# Patient Record
Sex: Male | Born: 1975 | Hispanic: Yes | Marital: Married | Smoking: Never smoker
Health system: Southern US, Community
[De-identification: ages and names within clinical notes are randomized; demographics above are authoritative.]

## PROBLEM LIST (undated history)

## (undated) DIAGNOSIS — Z789 Other specified health status: Secondary | ICD-10-CM

## (undated) HISTORY — PX: CYST EXCISION: SHX5701

---

## 2011-06-05 ENCOUNTER — Ambulatory Visit: Payer: Self-pay

## 2011-06-05 DIAGNOSIS — L259 Unspecified contact dermatitis, unspecified cause: Secondary | ICD-10-CM

## 2011-06-05 DIAGNOSIS — L0291 Cutaneous abscess, unspecified: Secondary | ICD-10-CM

## 2014-09-17 ENCOUNTER — Ambulatory Visit (INDEPENDENT_AMBULATORY_CARE_PROVIDER_SITE_OTHER): Payer: Self-pay | Admitting: Physician Assistant

## 2014-09-17 VITALS — BP 130/70 | HR 71 | Temp 98.2°F | Resp 16 | Ht 70.5 in | Wt 210.0 lb

## 2014-09-17 DIAGNOSIS — J302 Other seasonal allergic rhinitis: Secondary | ICD-10-CM

## 2014-09-17 DIAGNOSIS — J069 Acute upper respiratory infection, unspecified: Secondary | ICD-10-CM

## 2014-09-17 MED ORDER — GUAIFENESIN ER 1200 MG PO TB12: 1.0000 | ORAL_TABLET | Freq: Two times a day (BID) | ORAL | Status: DC | PRN

## 2014-09-17 MED ORDER — IPRATROPIUM BROMIDE 0.03 % NA SOLN: 2.0000 | Freq: Two times a day (BID) | NASAL | Status: DC

## 2014-09-17 MED ORDER — CETIRIZINE HCL 10 MG PO TABS: 10.0000 mg | ORAL_TABLET | Freq: Every day | ORAL | Status: DC

## 2014-09-17 NOTE — Patient Instructions (Signed)
If you start to develop a cough, contact me.    Infeccin de las vas areas superiores en los adultos (Upper Respiratory Infection, Adult)  La infeccin respiratoria de las vas areas superiores se conoce tambin como resfro comn. Las vas areas superiores [employer] senos nasales, la garganta, la trquea, y los bronquios. Los bronquios son las vas areas que conducen el aire a los pulmones. La mayor parte de las personas mejora luego de una Watson, Biomedical engineer los sntomas pueden durar [employer]. La tos residual puede durar ms. CAUSAS Varios tipos de virus pueden causar la infeccin de los tejidos que cubren las vas areas superiores. Los tejidos se irritan y se inflaman y se originan secreciones. Tambin es frecuente la produccin de moco. El resfro es contagioso. El virus se disemina fcilmente a otras personas por contacto oral. Aqu se incluyen los besos, el compartir un vaso y el toser o Engineering geologist. Tambin puede diseminarse tocndose la boca o la [place] y luego tocando una superficie que luego tocan Economist.  SNTOMAS Los sntomas se desarrollan entre uno y [place] luego de Cytogeneticist en contacto con el virus. Pueden variar de Neomia Dear persona a otra. Incluyen:  Secrecin nasal.  Estornudos  Congestin nasal.  Irritacin de los senos nasales.  Dolor de Advertising copywriter.  Prdida de la voz (laringitis).  Tos.  Fatiga.  Dolores musculares.  Prdida del apetito.  Dolor de [place].  Fiebre no muy elevada. DIAGNSTICO Puede diagnosticarse a s mismo la infeccin respiratoria, segn los sntomas habituales, ya que la mayor parte de las personas se resfra dos o tres veces al ao. El profesional puede confirmarlo basndose en el examen fsico. Lo ms importante es que el profesional verifique que los sntomas no se deben a otra enfermedad como anginas, sinusitis, neumona, asma o epiglotitis. Para diagnosticar el resfrio comn, no es necesario que haga anlisis de [place], pruebas  en la garganta o radiografas, pero en algunos casos puede ser de utilidad para excluir otros problemas ms graves. El mdico decidir si necesita otras pruebas. RIESGOS Y COMPLICACIONES Tendr mayor riesgo de sufrir un resfro grave si consume cigarrillos, sufre una enfermedad cardaca (como insuficiencia cardaca) o pulmonar crnica (como asma) o si tiene un debilitamiento del sistema inmunolgico. Las personas muy jvenes o muy mayores tienen riesgo de sufrir infecciones ms graves. La sinusitis bacteriana, las infecciones del odo medio y la neumona bacteriana pueden complicar el resfro comn. El resfro puede exacerbar el asma y la enfermedad pulmonar obstructiva crnica. En algunos casos estas complicaciones requieren la atencin en un servicio de emergencias y pueden poner en peligro la vida. PREVENCIN La mejor manera de protegerse para no contraer un resfro es Pharmacologist una buena higiene. Evite el contacto bucal o de las manos con personas con sntomas de resfro. Si se produce el contacto, lvese las manos con frecuencia. No hay pruebas firmes que indiquen que la vitamina C, la vitamina E, la equincea o la actividad fsica reduzcan las posibilidades de tener una infeccin. Sin embargo, siempre se recomienda Insurance account manager y Winferd Humphrey buena nutricin. TRATAMIENTO El tratamiento est dirigido a Consulting civil engineer sntomas. Esta enfermedad no tiene [place]. Los antibiticos no son eficaces, ya que esta infeccin la causa un virus y no una bacteria. El tratamiento incluye:  Aumente la ingesta de lquidos. Consumo de bebidas deportivas, que proporcionan electrolitos,azcares e hidratacin.  Inhale vapor caliente (de un vaporizador o de la ducha).  Tomar sopa de pollo u otros lquidos claros, y [employer] buena nutricin.  Descanse lo suficiente.  Haga grgaras o coma pastillas para Altria Groupaliviar las molestias.  Control de la fiebre con ibuprofeno o acetaminofen, segn las indicaciones del  mdico.  Aumento del uso del inhalador, si sufre asma. Las pastillas y los geles de zinc durante las primeras 24 horas de iniciado el resfro comn, pueden disminuir la duracin y Paramedicaliviar la gravedad de los sntomas. Los medicamentos para Chief Technology Officerel dolor pueden disminuir la fiebre, Paramedicaliviar los dolores musculares y Chief Technology Officerel dolor de Advertising copywritergarganta. Se dispone de una gran variedad de medicamentos de venta libre para tratar la congestin y la secrecin nasal. El profesional podr recomendarle inhalantes para los otros sntomas. INSTRUCCIONES PARA EL CUIDADO DOMICILIARIO  Utilice los medicamentos de venta libre o de prescripcin para Chief Technology Officerel dolor, el malestar o la [place]fiebre, segn se lo indique el profesional que lo asiste.  Utilice un vaporizador caliente o inhale vapor, haciendo salir agua de la ducha para aumentar la humedad [place] Cityambiente. Esto mantendr las secreciones hmedas y Community education officerle resultar ms fcil respirar.  Beba gran cantidad de lquido para mantener la orina de tono claro o color amarillo plido.  Descanse todo lo que pueda.  Regrese a su trabajo cuando la temperatura se haya normalizado, o cuando el profesional que lo asiste se lo indique. Quizs sea necesario que permanezca en su casa durante un tiempo ms prolongado para Buyer, retailevitar infectar a Economistotras personas. Tambin puede utilizar un barbijo y ser cuidadoso con el lavado de manos para evitar la diseminacin del virus. SOLICITE ATENCIN MDICA SI:  Luego de los primeros das siente que empeora en vez de [place]mejorar.  Necesita que Occupational psychologistel profesional le brinde ms informacin relacionada con los medicamentos para AGCO Corporationcontrolar los sntomas.  Siente escalofros, le falta el aire o escupe moco de color marrn o rojo. Estos pueden ser sntomas de neumona.  Tiene una secrecin nasal de color amarillo o marrn, o siente dolor en el rostro, especialmente cuando se inclina hacia adelante. Estos pueden ser sntomas de sinusitis.  Tiene fiebre, siente el cuello hinchado, tiene dolor al  tragar u observa manchas blancas en el fondo de la garganta. Estos pueden ser sntomas de angina por estreptococo. Romona CurlsSOLICITE ATENCIN MDICA DE INMEDIATO SI:  Lance Mussiene fiebre.  Comienza a sentir Herbalistun dolor de cabeza intenso o persistente, dolor de odos, en el seno nasal o en el pecho.  Tiene tos y esta se prolonga demasiado, tose y escupe sangre, la mucosidad habitual se modifica (si tiene una enfermedad pulmonar crnica) o respira con dificultad.  Siente rigidez en el cuello o dolor de cabeza intenso. Document Released: 02/27/2005 Document Revised: 08/12/2011 [facility] Patient Information 2015 [place]ExitCare, MarylandLL[place]. This information is not intended to replace advice given to you by your health care provider. Make sure you discuss any questions you have with your health care provider.

## 2014-09-17 NOTE — Progress Notes (Signed)
[facility] [address], [place] [id number] [phone]  Date:  09/17/2014   Name:  Patrick Diaz   DOB:  [birth year]   MRN:  [id number]  PCP:  No primary care provider on file.    Chief Complaint: Headache and Sore Throat   History of Present Illness:  Patrick Diaz is a 39 y.o. very pleasant male patient who presents with the following:  Patient reports 2 days of sorethroat, nasal congestion, cough, frontal headache.  He reports some chills.  He has done nothing for relief.  He denies sob, dyspnea.  Cough is non-productive.  He has noticed increased congestion since the .  He has noticed his symptoms have been present since the pollen erupted.  He has noticed that with the start of pollen he has had some allergies.   He has no GI symptoms of abdominal pain, nausea, vomiting, or diarrhea.   There are no active problems to display for this patient.   History reviewed. No pertinent past medical history.  History reviewed. No pertinent past surgical history.  History  Substance Use Topics  . Smoking status: Never Smoker   . Smokeless tobacco: Not on file  . Alcohol Use: Not on file    Family History  Problem Relation Age of Onset  . Cancer Mother     No Known Allergies  Medication list has been reviewed and updated.  No current outpatient prescriptions on file prior to visit.   No current facility-administered medications on file prior to visit.    Review of Systems: ROS otherwise unremarkable unless listed above.   Physical Examination: Filed Vitals:   09/17/14 0855  BP: 130/70  Pulse: 71  Temp: 98.2 F (36.8 C)  Resp: 16   Filed Vitals:   09/17/14 0855  Height: 5' 10.5" (1.791 m)  Weight: 210 lb (95.255 kg)   Body mass index is 29.7 kg/(m^2). Ideal Body Weight: Weight in (lb) to have BMI = 25: 176.4  Physical Exam  Constitutional: He appears well-developed and well-nourished.  HENT:  Head: Normocephalic and atraumatic.   Right Ear: Tympanic membrane, external ear and ear canal normal.  Left Ear: Tympanic membrane, external ear and ear canal normal.  Nose: Mucosal edema and rhinorrhea present. Right sinus exhibits no maxillary sinus tenderness and no frontal sinus tenderness. Left sinus exhibits no maxillary sinus tenderness and no frontal sinus tenderness.  Mouth/Throat: Posterior oropharyngeal erythema (Mild erythema with nasal drip) present. No oropharyngeal exudate or posterior oropharyngeal edema.  Eyes: Conjunctivae and EOM are normal. Pupils are equal, round, and reactive to light.  Cardiovascular: Normal rate and regular rhythm.  Exam reveals no gallop, no distant heart sounds and no friction rub.   No murmur heard. Pulmonary/Chest: No apnea and no tachypnea. No respiratory distress. He has no decreased breath sounds. He has no wheezes. He has no rhonchi.  Lymphadenopathy:       Head (right side): No submandibular, no tonsillar, no preauricular and no posterior auricular adenopathy present.       Head (left side): No submandibular, no tonsillar, no preauricular and no posterior auricular adenopathy present.  Skin: Skin is warm and dry.  Psychiatric: He has a normal mood and affect. His behavior is normal.     Assessment and Plan: 39 year old male with PMH listed above is here today for chief complaint of nasal congestion, sore throat, and headache.  This is likely viral URI, early in the symptoms, with allergies.  Upper  respiratory infection, viral - Plan: Guaifenesin (MUCINEX MAXIMUM STRENGTH) 1200 MG TB12, ipratropium (ATROVENT) 0.03 % nasal spray  Seasonal allergies - Plan: ipratropium (ATROVENT) 0.03 % nasal spray, cetirizine (ZYRTEC) 10 MG tablet  Trena Platt, PA-C [facility] 4/17/20167:34 PM

## 2016-09-03 ENCOUNTER — Ambulatory Visit (HOSPITAL_COMMUNITY)
Admission: EM | Admit: 2016-09-03 | Discharge: 2016-09-03 | Disposition: A | Discharge status: Home / Self Care | Payer: Self-pay | Attending: Family Medicine | Admitting: Family Medicine

## 2016-09-03 ENCOUNTER — Encounter (HOSPITAL_COMMUNITY): Payer: Self-pay | Admitting: Emergency Medicine

## 2016-09-03 ENCOUNTER — Ambulatory Visit (INDEPENDENT_AMBULATORY_CARE_PROVIDER_SITE_OTHER): Payer: Self-pay

## 2016-09-03 DIAGNOSIS — M542 Cervicalgia: Secondary | ICD-10-CM

## 2016-09-03 DIAGNOSIS — M791 Myalgia: Secondary | ICD-10-CM

## 2016-09-03 DIAGNOSIS — M7918 Myalgia, other site: Secondary | ICD-10-CM

## 2016-09-03 MED ORDER — DICLOFENAC SODIUM 75 MG PO TBEC: 75.0000 mg | DELAYED_RELEASE_TABLET | Freq: Two times a day (BID) | ORAL | 1 refills | Status: DC
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
## 2016-12-03 ENCOUNTER — Ambulatory Visit (INDEPENDENT_AMBULATORY_CARE_PROVIDER_SITE_OTHER): Payer: Self-pay

## 2016-12-03 ENCOUNTER — Ambulatory Visit (INDEPENDENT_AMBULATORY_CARE_PROVIDER_SITE_OTHER): Payer: Self-pay | Admitting: Physician Assistant

## 2016-12-03 ENCOUNTER — Encounter: Payer: Self-pay | Admitting: Physician Assistant

## 2016-12-03 VITALS — BP 123/70 | HR 60 | Temp 97.2°F | Resp 17 | Ht 70.0 in | Wt 223.0 lb

## 2016-12-03 DIAGNOSIS — R079 Chest pain, unspecified: Secondary | ICD-10-CM

## 2016-12-03 LAB — POCT URINALYSIS DIP (MANUAL ENTRY)
BILIRUBIN UA: NEGATIVE
Glucose, UA: NEGATIVE mg/dL
Ketones, POC UA: NEGATIVE mg/dL
Leukocytes, UA: NEGATIVE
NITRITE UA: NEGATIVE
Protein Ur, POC: NEGATIVE mg/dL
RBC UA: NEGATIVE
Spec Grav, UA: 1.015 (ref 1.010–1.025)
Urobilinogen, UA: 0.2 E.U./dL
pH, UA: 7 (ref 5.0–8.0)

## 2016-12-03 LAB — POCT CBC
Granulocyte percent: 63.1 %G (ref 37–80)
HEMATOCRIT: 42.9 % — AB (ref 43.5–53.7)
Hemoglobin: 14.7 g/dL (ref 14.1–18.1)
Lymph, poc: 1.9 (ref 0.6–3.4)
MCH: 30.9 pg (ref 27–31.2)
MCHC: 34.2 g/dL (ref 31.8–35.4)
MCV: 90.3 fL (ref 80–97)
MID (CBC): 0.3 (ref 0–0.9)
MPV: 6.6 fL (ref 0–99.8)
POC GRANULOCYTE: 3.8 (ref 2–6.9)
POC LYMPH PERCENT: 31.1 %L (ref 10–50)
POC MID %: 5.8 %M (ref 0–12)
Platelet Count, POC: 278 10*3/uL (ref 142–424)
RBC: 4.76 M/uL (ref 4.69–6.13)
RDW, POC: 13.1 %
WBC: 6 10*3/uL (ref 4.6–10.2)

## 2016-12-03 MED ORDER — NAPROXEN 500 MG PO TABS
500.0000 mg | ORAL_TABLET | Freq: Two times a day (BID) | ORAL | 0 refills | Status: DC
Start: 2016-12-03 — End: 2018-06-24

## 2016-12-03 NOTE — Progress Notes (Signed)
12/03/2016 3:01 PM   DOB: April 02, 1976 / MRN: [id number]  SUBJECTIVE:  Patrick Diaz is a 41 y.o. male presenting for right sided rib pain that has been present for about 3 days.  Tells me his ribs are tender to touch.  Denies pain with respiration, cough, nausea, diaphoresis, presyncope, SOB and new DOE.   He has No Known Allergies.   He  has no past medical history on file.    He  reports that he has never smoked. He has never used smokeless tobacco. He reports that he does not drink alcohol or use drugs. He  reports that he does not engage in sexual activity. The patient  has no past surgical history on file.  His family history includes Cancer in his mother.  Review of Systems  Constitutional: Negative for chills, diaphoresis and fever.  Respiratory: Negative for cough, hemoptysis, sputum production, shortness of breath and wheezing.   Cardiovascular: Positive for chest pain. Negative for orthopnea and leg swelling.  Gastrointestinal: Negative for nausea.  Skin: Negative for rash.  Neurological: Negative for dizziness.    The problem list and medications were reviewed and updated by myself where necessary and exist elsewhere in the encounter.   OBJECTIVE:  BP 123/70   Pulse 60   Temp (!) 97.2 F (36.2 C) (Oral)   Resp 17   Ht _0  (1.778 m)   Wt 223 lb (101.2 kg)   SpO2 98%   BMI 32.00 kg/m   No results found for: HGBA1C  No results found for: CHOL, HDL, LDLCALC, LDLDIRECT, TRIG, CHOLHDL    Physical Exam  Constitutional: He is oriented to person, place, and time. He appears well-developed. He is active and cooperative.  Non-toxic appearance.  Eyes: EOM are normal. Pupils are equal, round, and reactive to light.  Cardiovascular: Normal rate, regular rhythm, S1 normal, S2 normal, normal heart sounds, intact distal pulses and normal pulses.  Exam reveals no gallop and no friction rub.   No murmur heard. Pulmonary/Chest: Effort normal. No stridor. No tachypnea. No  respiratory distress. He has no wheezes. He has no rales.    Abdominal: He exhibits no distension.  Musculoskeletal: He exhibits no edema.  Neurological: He is alert and oriented to person, place, and time. He has normal strength and normal reflexes. He is not disoriented. No cranial nerve deficit or sensory deficit. He exhibits normal muscle tone. Coordination and gait normal.  Skin: Skin is warm and dry. He is not diaphoretic. No pallor.  Psychiatric: His behavior is normal.  Vitals reviewed.   Results for orders placed or performed in visit on 12/03/16 (from the past 72 hour(s))  POCT CBC     Status: Abnormal   Collection Time: 12/03/16  2:24 PM  Result Value Ref Range   WBC 6.0 4.6 - 10.2 K/uL   Lymph, poc 1.9 0.6 - 3.4   POC LYMPH PERCENT 31.1 10 - 50 %L   MID (cbc) 0.3 0 - 0.9   POC MID % 5.8 0 - 12 %M   POC Granulocyte 3.8 2 - 6.9   Granulocyte percent 63.1 37 - 80 %G   RBC 4.76 4.69 - 6.13 M/uL   Hemoglobin 14.7 14.1 - 18.1 g/dL   HCT, POC 42.9 (A) 43.5 - 53.7 %   MCV 90.3 80 - 97 fL   MCH, POC 30.9 27 - 31.2 pg   MCHC 34.2 31.8 - 35.4 g/dL   RDW, POC 13.1 %   Platelet Count,  POC 278 142 - 424 K/uL   MPV 6.6 0 - 99.8 fL  POCT urinalysis dipstick     Status: None   Collection Time: 12/03/16  2:58 PM  Result Value Ref Range   Color, UA yellow yellow   Clarity, UA clear clear   Glucose, UA negative negative mg/dL   Bilirubin, UA negative negative   Ketones, POC UA negative negative mg/dL   Spec Grav, UA 1.015 1.010 - 1.025   Blood, UA negative negative   pH, UA 7.0 5.0 - 8.0   Protein Ur, POC negative negative mg/dL   Urobilinogen, UA 0.2 0.2 or 1.0 E.U./dL   Nitrite, UA Negative Negative   Leukocytes, UA Negative Negative    Dg Chest 2 View  Result Date: 12/03/2016 CLINICAL DATA:  Left-sided rib pain.  No injury. EXAM: CHEST  2 VIEW COMPARISON:  No prior . FINDINGS: No acute cardiopulmonary disease. Low lung volumes with mild basilar atelectasis. No pleural  effusion or pneumothorax. No acute bony abnormality . IMPRESSION: No acute cardiopulmonary disease. Electronically Signed   By: Marcello Moores  Register   On: 12/03/2016 14:34    ASSESSMENT AND PLAN:  Cebert was seen today for chest pain.  Diagnoses and all orders for this visit:  Chest pain, unspecified type: EKG reassuring.  Rads reassuring.  TTP about the ribs is reassuring.  Will treat MSK etiology.  He can come back anytime for this problem.  -     EKG 12-Lead -     POCT CBC -     CMP14+EGFR -     POCT urinalysis dipstick -     DG Chest 2 View; Future    The patient is advised to call or return to clinic if he does not see an improvement in symptoms, or to seek the care of the closest emergency department if he worsens with the above plan.   Philis Fendt, MHS, PA-C [facility] 12/03/2016 3:01 PM

## 2016-12-03 NOTE — Patient Instructions (Signed)
     IF you received an x-ray today, you will receive an invoice from [facility]. Please contact [facility] at [phone] with questions or concerns regarding your invoice.   IF you received labwork today, you will receive an invoice from [facility]. Please contact [facility] at [phone] with questions or concerns regarding your invoice.   Our billing staff will not be able to assist you with questions regarding bills from these companies.  You will be contacted with the lab results as soon as they are available. The fastest way to get your results is to activate your My Chart account. Instructions are located on the last page of this paperwork. If you have not heard from us regarding the results in 2 weeks, please contact this office.     

## 2016-12-04 LAB — CMP14+EGFR
ALT: 25 IU/L (ref 0–44)
AST: 23 IU/L (ref 0–40)
Albumin/Globulin Ratio: 1.7 (ref 1.2–2.2)
Albumin: 4.1 g/dL (ref 3.5–5.5)
Alkaline Phosphatase: 93 IU/L (ref 39–117)
BUN/Creatinine Ratio: 18 (ref 9–20)
BUN: 13 mg/dL (ref 6–24)
Bilirubin Total: 0.4 mg/dL (ref 0.0–1.2)
CO2: 22 mmol/L (ref 20–29)
CREATININE: 0.72 mg/dL — AB (ref 0.76–1.27)
Calcium: 9.1 mg/dL (ref 8.7–10.2)
Chloride: 104 mmol/L (ref 96–106)
GFR calc non Af Amer: 115 mL/min/{1.73_m2} (ref >59)
GFR, EST AFRICAN AMERICAN: 133 mL/min/{1.73_m2} (ref >59)
Globulin, Total: 2.4 g/dL (ref 1.5–4.5)
Glucose: 97 mg/dL (ref 65–99)
Potassium: 4.1 mmol/L (ref 3.5–5.2)
Sodium: 141 mmol/L (ref 134–144)
Total Protein: 6.5 g/dL (ref 6.0–8.5)

## 2018-04-27 IMAGING — DX DG CHEST 2V
2 series · 2 of 2 positions shown · non-contrast
Comparison: No prior .

CLINICAL DATA: Left-sided rib pain.  No injury.

EXAM:
CHEST  2 VIEW

[chest pa]
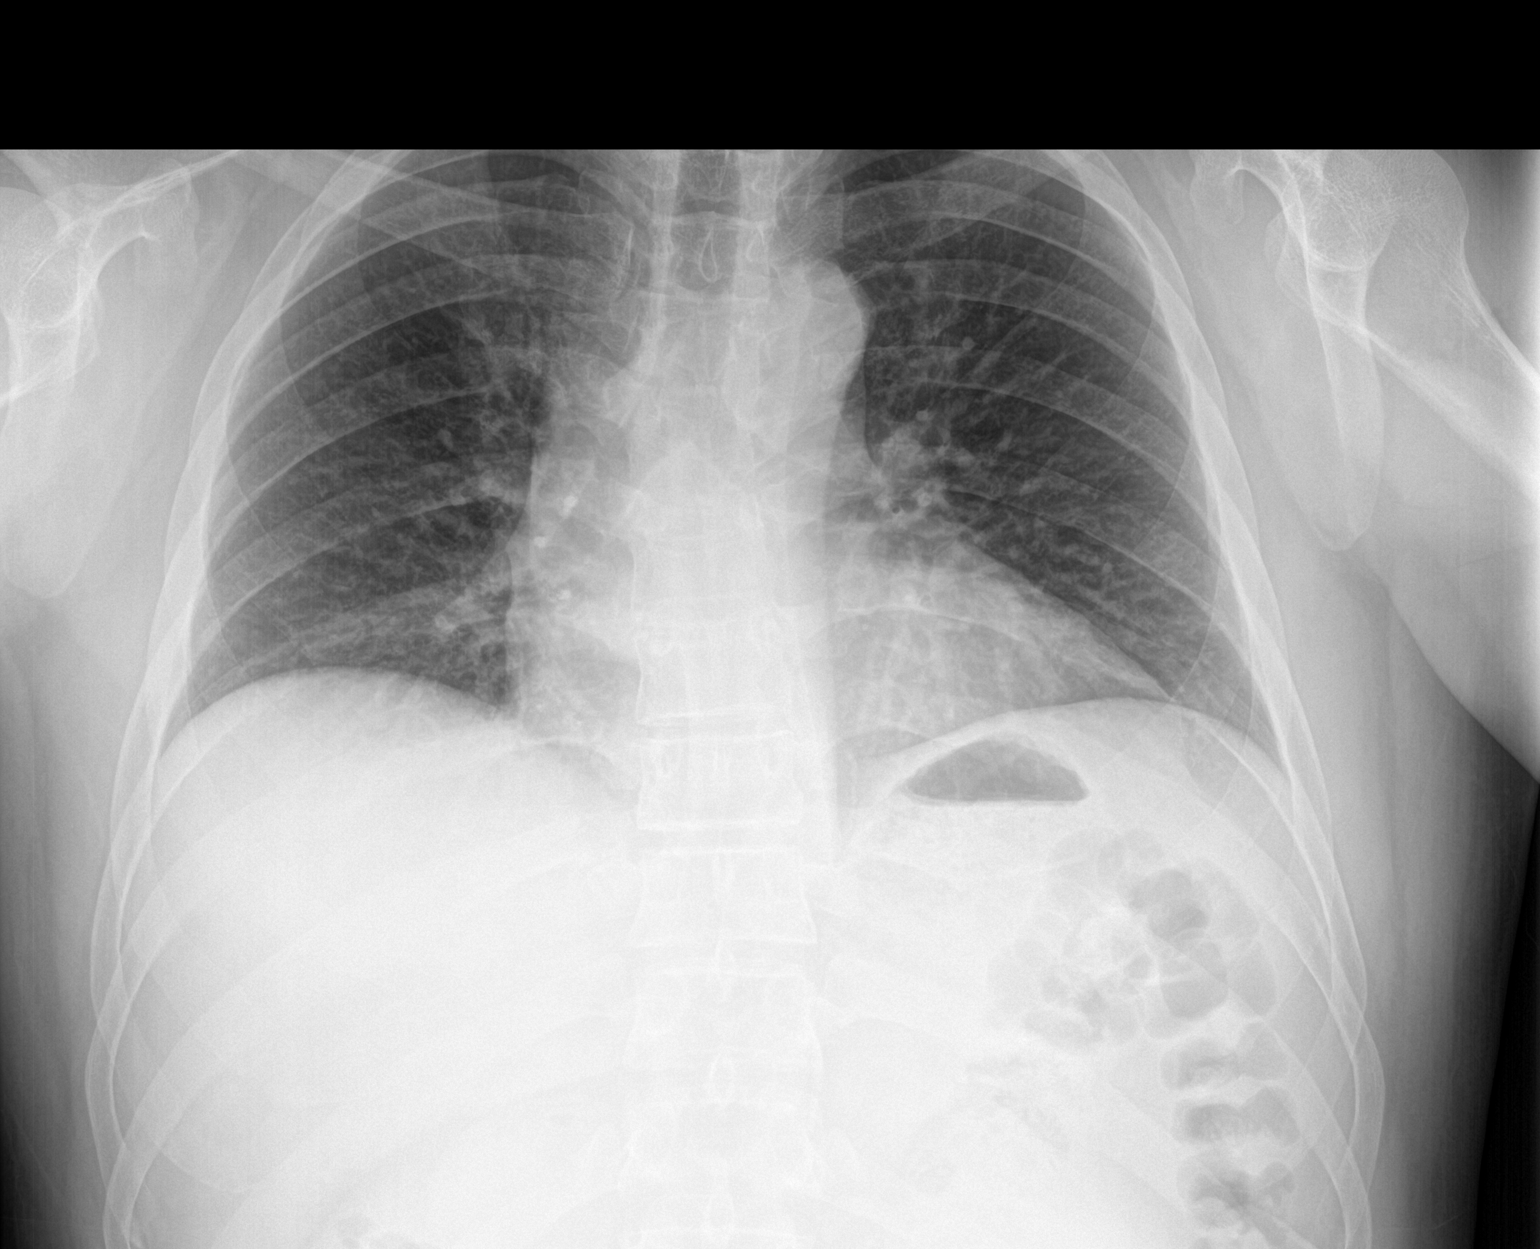

[chest lat]
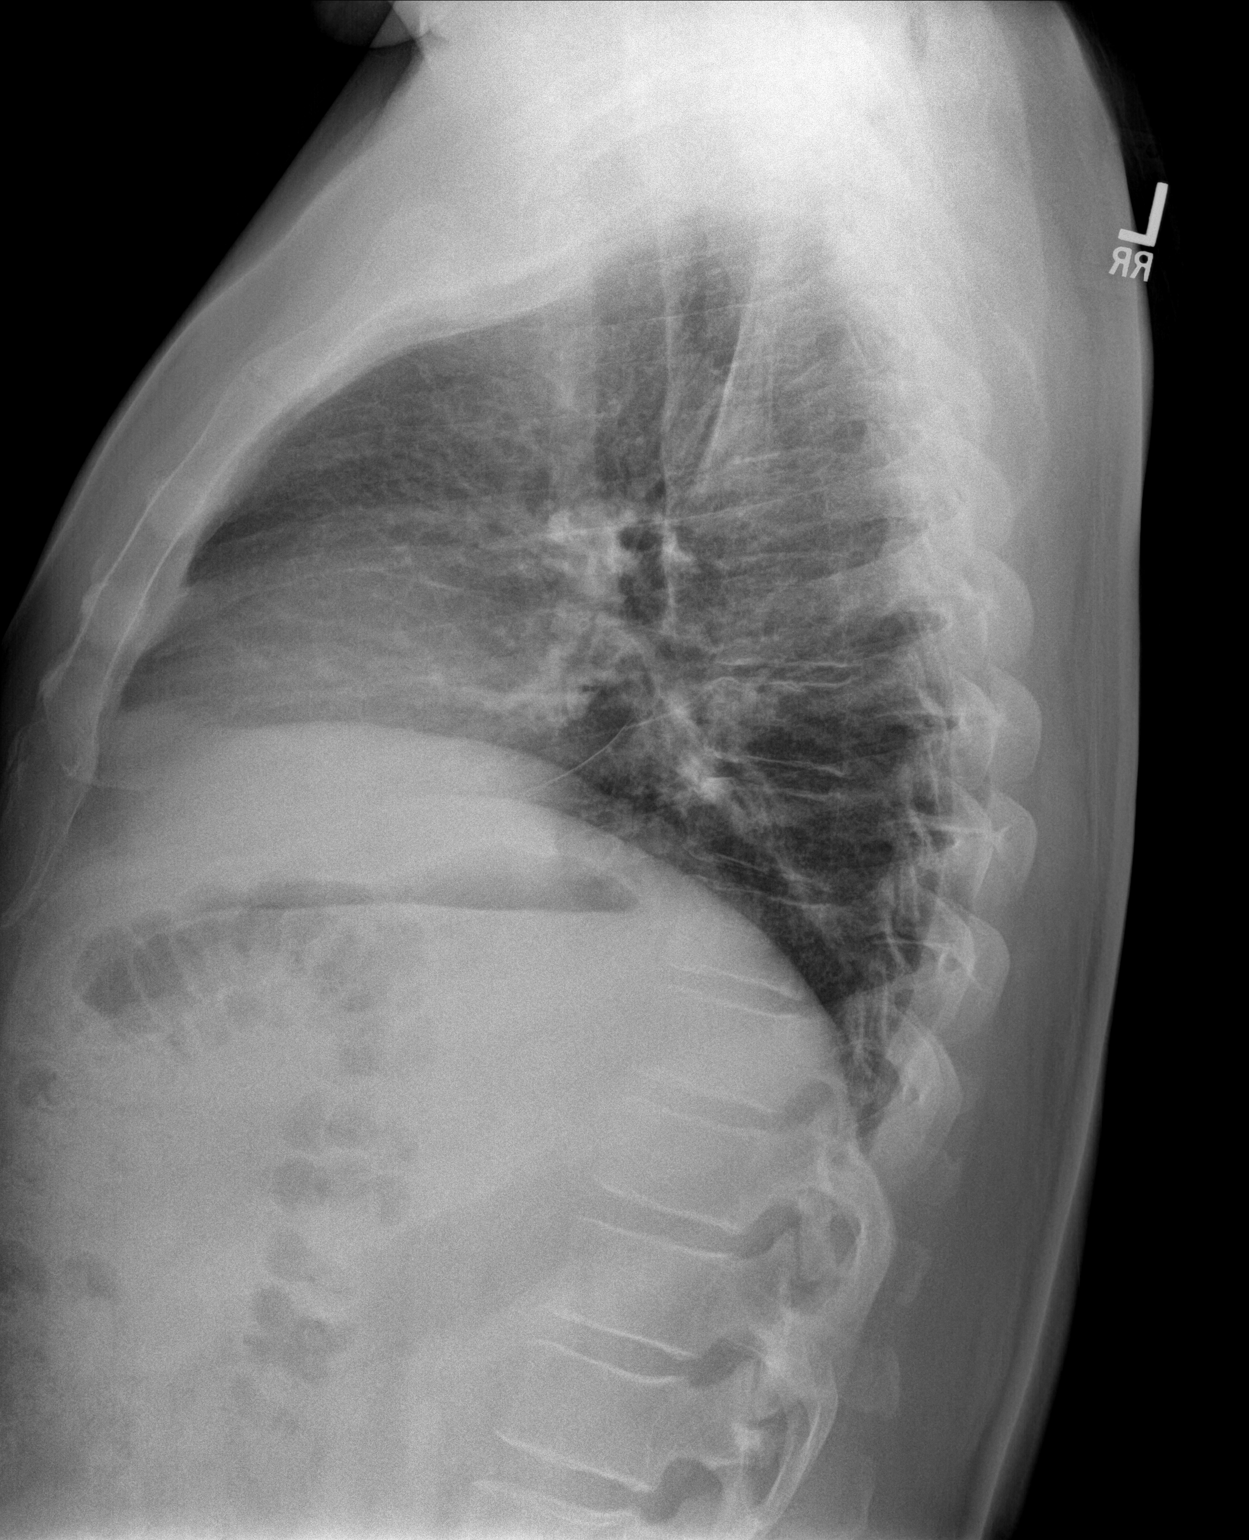

[2 of 2 positions shown; findings below may reference images not displayed]

FINDINGS: No acute cardiopulmonary disease. Low lung volumes with mild basilar
atelectasis. No pleural effusion or pneumothorax. No acute bony
abnormality .
IMPRESSION: No acute cardiopulmonary disease.

## 2018-06-26 ENCOUNTER — Encounter: Payer: Self-pay | Admitting: Family Medicine

## 2018-06-26 ENCOUNTER — Ambulatory Visit (INDEPENDENT_AMBULATORY_CARE_PROVIDER_SITE_OTHER): Payer: Self-pay | Admitting: Family Medicine

## 2018-06-26 VITALS — BP 127/68 | HR 67 | Resp 17 | Ht 68.0 in | Wt 229.0 lb

## 2018-06-26 DIAGNOSIS — K429 Umbilical hernia without obstruction or gangrene: Secondary | ICD-10-CM

## 2018-06-26 DIAGNOSIS — Z114 Encounter for screening for human immunodeficiency virus [HIV]: Secondary | ICD-10-CM

## 2018-06-26 DIAGNOSIS — Z711 Person with feared health complaint in whom no diagnosis is made: Secondary | ICD-10-CM

## 2018-06-26 DIAGNOSIS — Z13220 Encounter for screening for lipoid disorders: Secondary | ICD-10-CM

## 2018-06-26 DIAGNOSIS — Z131 Encounter for screening for diabetes mellitus: Secondary | ICD-10-CM

## 2018-06-26 DIAGNOSIS — Z1329 Encounter for screening for other suspected endocrine disorder: Secondary | ICD-10-CM

## 2018-06-26 DIAGNOSIS — Z130 Encounter for screening for diseases of the blood and blood-forming organs and certain disorders involving the immune mechanism: Secondary | ICD-10-CM

## 2018-06-26 DIAGNOSIS — R1033 Periumbilical pain: Secondary | ICD-10-CM

## 2018-06-26 DIAGNOSIS — Z23 Encounter for immunization: Secondary | ICD-10-CM

## 2018-06-26 DIAGNOSIS — E782 Mixed hyperlipidemia: Secondary | ICD-10-CM

## 2018-06-26 DIAGNOSIS — Z7689 Persons encountering health services in other specified circumstances: Secondary | ICD-10-CM

## 2018-06-26 LAB — POCT URINALYSIS DIP (CLINITEK)
Bilirubin, UA: NEGATIVE
Blood, UA: NEGATIVE
Glucose, UA: NEGATIVE mg/dL
Ketones, POC UA: NEGATIVE mg/dL
LEUKOCYTES UA: NEGATIVE
NITRITE UA: NEGATIVE
POC PROTEIN,UA: NEGATIVE
Spec Grav, UA: >=1.03 — AB (ref 1.010–1.025)
UROBILINOGEN UA: 0.2 U/dL
pH, UA: 5.5 (ref 5.0–8.0)

## 2018-06-26 NOTE — Progress Notes (Signed)
Patrick Diaz, is a 43 y.o. male  WUJ:[id number]:[id number]  NFA:[id number]  DOB - 08-25-1975  CC:  Chief Complaint  Patient presents with  . Establish Care  . Umbilical Hernia    x 6 months. increasing in size       HPI: Patrick Diaz is a 43 y.o. male is here today to establish care.   [facility] does not have a problem list on file.   Hernia Concern with increasing size and painful hernia.Onset 2-3 months prior. He complains of increased pain with lifting heavy objects.No formal diagnosis. With cough he can palpate small bump. Situated in the middle of abdomen and lower part of umbilicus. Uncertain if single or multiple hernias. Denies nausea, diarrhea, vomiting. Normal bowel movements, although abdominal pain occurs with straining and forced defecation. Patient denies new headaches, chest pain, chills, fever, unintentional weight loss, or SOB.   Current medications:No current outpatient medications on file.   Pertinent family medical history: family history includes Cancer in his mother; Healthy in his father.   No Known Allergies  Social History   Socioeconomic History  . Marital status: Married    Spouse name: Not on file  . Number of children: Not on file  . Years of education: Not on file  . Highest education level: Not on file  Occupational History  . Not on file  Social Needs  . Financial resource strain: Not on file  . Food insecurity:    Worry: Not on file    Inability: Not on file  . Transportation needs:    Medical: Not on file    Non-medical: Not on file  Tobacco Use  . Smoking status: Never Smoker  . Smokeless tobacco: Never Used  Substance and Sexual Activity  . Alcohol use: No    Alcohol/week: 0.0 standard drinks  . Drug use: No  . Sexual activity: Never  Lifestyle  . Physical activity:    Days per week: Not on file    Minutes per session: Not on file  . Stress: Not on file  Relationships  . Social connections:    Talks on phone: Not on file    Gets together:  Not on file    Attends religious service: Not on file    Active member of club or organization: Not on file    Attends meetings of clubs or organizations: Not on file    Relationship status: Not on file  . Intimate partner violence:    Fear of current or ex partner: Not on file    Emotionally abused: Not on file    Physically abused: Not on file    Forced sexual activity: Not on file  Other Topics Concern  . Not on file  Social History Narrative  . Not on file    Review of Systems: Pertinent negatives listed in HPI Objective:   Vitals:   06/26/18 0839  BP: 127/68  Pulse: 67  Resp: 17  SpO2: 96%    BP Readings from Last 3 Encounters:  06/26/18 127/68  12/03/16 123/70  09/03/16 126/73    Filed Weights   06/26/18 0839  Weight: 229 lb (103.9 kg)      Physical Exam: Constitutional: Patient appears well-developed and well-nourished. No distress. HENT: Normocephalic, atraumatic, External right and left ear normal. Oropharynx is clear and moist.  Eyes: Conjunctivae and EOM are normal. PERRLA, no scleral icterus. Neck: Normal ROM. Neck supple. No JVD. No tracheal deviation. No thyromegaly. CVS: RRR, S1/S2 +, no murmurs, no gallops,  no carotid bruit.  Pulmonary: Effort and breath sounds normal, no stridor, rhonchi, wheezes, rales.  Abdominal: Soft. BS +, mobile mass at umbilicus-consistent with hernia, reducible with deep palpation. Negative tenderness, rebound or guarding.  Musculoskeletal: Normal range of motion. No edema and no tenderness.  Neuro: Alert. Normal muscle tone coordination. Normal gait. BUE and BLE strength 5/5. Bilateral hand grips symmetrical. No cranial nerve deficit. Skin: Skin is warm and dry. No rash noted. Not diaphoretic. No erythema. No pallor. Psychiatric: Normal mood and affect. Behavior, judgment, thought content normal.  Lab Results (prior encounters)  Lab Results  Component Value Date   WBC 6.0 12/03/2016   HGB 14.7 12/03/2016   HCT 42.9  (A) 12/03/2016   MCV 90.3 12/03/2016   Lab Results  Component Value Date   CREATININE 0.72 (L) 12/03/2016   BUN 13 12/03/2016   NA 141 12/03/2016   K 4.1 12/03/2016   CL 104 12/03/2016   CO2 22 12/03/2016       Assessment and plan:  1. Encounter to establish care  2. Screening for diabetes mellitus - POCT URINALYSIS DIP (CLINITEK) - Microalbumin/Creatinine Ratio, Urine - Glucose (CBG) - Comprehensive metabolic panel - Hemoglobin A1c; Future  3. Screening for thyroid disorder - Thyroid Panel With TSH  4. Screening, lipid - Lipid panel  5. Screening for deficiency anemia - CBC with Differential  6. Concern about hernia without diagnosis   7. Screening for HIV without presence of risk factors - HIV antibody (with reflex)  8. Periumbilical abdominal pain - CT Abdomen Pelvis W Contrast; Future Upon review of CT of abdomen will refer to general surgery. Patient advised that he will be responsible for paying for visit out of pocket as he is uninsured and [facility] Financial Assistance routinely doesn't cover referrals to General Surgery.  Given financial packet.   9. Need for Tdap vaccination - Tdap vaccine greater than or equal to 7yo IM   Return in about 3 months (around 09/25/2018) for hernia-needs financial paperwork.   The patient was given clear instructions to go to ER or return to medical center if symptoms don't improve, worsen or new problems develop. The patient verbalized understanding. The patient was advised  to call and obtain lab results if they haven't heard anything from out office within 7-10 business days.  Joaquin CourtsKimberly Tyniya Kuyper, FNP [facility] [address], [place] [id number] [phone]: [phone]    This note has been created with Dragon speech recognition software and Paediatric nursesmart phrase technology. Any transcriptional errors are unintentional.

## 2018-06-26 NOTE — Patient Instructions (Addendum)
Thank you for choosing Primary Care at [facility] to be your medical home!    [place] was seen by Joaquin Courts, FNP today.   Lars Mage [place]'s primary care provider is Bing Neighbors, FNP.   For the best care possible, you should try to see Joaquin Courts, FNP-C whenever you come to the clinic.   We look forward to seeing you again soon!  If you have any questions about your visit today, please call us at [phone] or feel free to reach your primary care provider via MyChart.     You will be notified via phone of your scheduled CT of the abdomen.  Se le notificar por telfono de su tomografa computarizada programada del abdomen.   Hernia umbilical en los adultos Umbilical Hernia, Adult  Una hernia es un bulto de tejido que sobresale a travs de una abertura [employer]. Una hernia umbilical se produce en el abdomen, cerca del ombligo. La hernia puede contener tejidos del intestino delgado, el intestino grueso o tejido graso que recubre el intestino (epipln). Las hernias USAA en los adultos suelen empeorar con el tiempo y requieren tratamiento quirrgico. Hay varios tipos de hernias umbilicales. Es posible que tenga lo siguiente:  Una hernia ubicada justo por debajo o por arriba del ombligo (hernia indirecta). Es el tipo de hernia umbilical ms frecuente en los adultos.  Una hernia que se forma a travs de una abertura hecha por el ombligo (hernia directa).  Una hernia que aparece y desaparece (hernia reducible). Una hernia reducible puede ser visible solo al hacer fuerza, levantar objetos pesados o toser. Este tipo de hernia se puede reintroducir en el abdomen (reducir).  Una hernia que aprisiona tejido abdominal (hernia encarcelada). Este tipo de hernia es irreducible.  Una hernia que interrumpe el flujo de sangre a los tejidos en su interior (hernia estrangulada). Si esto ocurre, los tejidos Mining engineer a Furniture conservator/restorer. Este tipo de hernia requiere  tratamiento de Associate Professor. Cules son las causas? Una hernia umbilical se produce cuando el tejido dentro del abdomen ejerce presin sobre una zona debilitada de los msculos abdominales. Qu incrementa el riesgo? Puede correr un mayor riesgo de Warehouse manager esta afeccin en los siguientes casos:  Tiene obesidad.  Tuvo varios embarazos.  Tiene una acumulacin de lquido dentro del abdomen (ascitis).  Se someti a una ciruga que [employer] abdominales. Cules son los signos o sntomas? El principal sntoma de esta afeccin es un bulto en el ombligo o cerca de este que no causa dolor. Una hernia reducible puede ser visible solo al hacer fuerza, levantar objetos pesados o toser. Otros sntomas pueden incluir lo siguiente:  Dolor sordo.  Sensacin de opresin. Los sntomas de una hernia estrangulada pueden incluir los siguientes:  Dolor que se vuelve cada vez ms intenso.  Nuseas y vmitos.  Dolor al ejercer presin sobre la hernia.  Cambio de color de la piel que recubre la hernia que se torna roja o violcea.  Estreimiento.  Sangre en las heces. Cmo se diagnostica? Esta afeccin se puede diagnosticar en funcin de lo siguiente:  Un examen fsico. Pueden pedirle que tosa o que haga fuerza mientras est de pie. Estas acciones aumentan la presin dentro del abdomen y empujan la hernia a travs de la abertura en los msculos. El mdico puede ejercer presin sobre la hernia para tratar de reducirla.  Los sntomas y los antecedentes mdicos. Cmo se trata? La ciruga es el nico tratamiento para una hernia umbilical. En el caso  de que la hernia est estrangulada, esta se realiza lo antes posible. Si tiene una hernia pequea que no est encarcelada, tal vez tenga que bajar de peso antes de la Azerbaijanciruga. Siga estas indicaciones en su casa:  Baje de peso, si se lo indic el mdico.  No trate de reintroducir la hernia.  Observe si la hernia cambia de color o de tamao.  Infrmele al mdico si se producen cambios.  Tal vez deba evitar las actividades que aumentan la presin sobre la hernia.  No levante objetos que pesen ms de 10libras (4.5kg) hasta que el mdico le diga que es seguro.  Tome los medicamentos de venta libre y los recetados solamente como se lo haya indicado el mdico.  OceanographerConcurra a todas las visitas de control como se lo haya indicado el mdico. Esto es importante. Comunquese con un mdico si:  La hernia se agranda.  La hernia le causa dolor. Solicite ayuda de inmediato si:  Tiene un dolor intenso y repentino cerca de la zona de la hernia.  Tiene dolor, as como nuseas o vmitos.  Tiene dolor y la piel que recubre la hernia cambia de color.  Presenta fiebre. Esta informacin no tiene Theme park managercomo fin reemplazar el consejo del mdico. Asegrese de hacerle al mdico cualquier pregunta que tenga. Document Released: 01/25/2016 Document Revised: 09/01/2017 Document Reviewed: 02/24/2017 Elsevier Interactive Patient Education  2019 [employer]

## 2018-06-27 LAB — COMPREHENSIVE METABOLIC PANEL
A/G RATIO: 1.6 (ref 1.2–2.2)
ALT: 25 IU/L (ref 0–44)
AST: 18 IU/L (ref 0–40)
Albumin: 4.2 g/dL (ref 4.0–5.0)
Alkaline Phosphatase: 93 IU/L (ref 39–117)
BILIRUBIN TOTAL: 0.5 mg/dL (ref 0.0–1.2)
BUN / CREAT RATIO: 27 — AB (ref 9–20)
BUN: 21 mg/dL (ref 6–24)
CALCIUM: 9.3 mg/dL (ref 8.7–10.2)
CHLORIDE: 104 mmol/L (ref 96–106)
CO2: 21 mmol/L (ref 20–29)
Creatinine, Ser: 0.77 mg/dL (ref 0.76–1.27)
GFR, EST AFRICAN AMERICAN: 128 mL/min/{1.73_m2} (ref >59)
GFR, EST NON AFRICAN AMERICAN: 110 mL/min/{1.73_m2} (ref >59)
GLUCOSE: 115 mg/dL — AB (ref 65–99)
Globulin, Total: 2.6 g/dL (ref 1.5–4.5)
Potassium: 4.3 mmol/L (ref 3.5–5.2)
Sodium: 142 mmol/L (ref 134–144)
TOTAL PROTEIN: 6.8 g/dL (ref 6.0–8.5)

## 2018-06-27 LAB — CBC WITH DIFFERENTIAL/PLATELET
BASOS ABS: 0.1 10*3/uL (ref 0.0–0.2)
BASOS: 1 %
EOS (ABSOLUTE): 0.1 10*3/uL (ref 0.0–0.4)
Eos: 3 %
HEMATOCRIT: 46.2 % (ref 37.5–51.0)
HEMOGLOBIN: 15.5 g/dL (ref 13.0–17.7)
IMMATURE GRANS (ABS): 0 10*3/uL (ref 0.0–0.1)
Immature Granulocytes: 0 %
LYMPHS ABS: 1.5 10*3/uL (ref 0.7–3.1)
LYMPHS: 31 %
MCH: 29.9 pg (ref 26.6–33.0)
MCHC: 33.5 g/dL (ref 31.5–35.7)
MCV: 89 fL (ref 79–97)
MONOCYTES: 8 %
Monocytes Absolute: 0.4 10*3/uL (ref 0.1–0.9)
NEUTROS ABS: 2.9 10*3/uL (ref 1.4–7.0)
Neutrophils: 57 %
Platelets: 281 10*3/uL (ref 150–450)
RBC: 5.18 x10E6/uL (ref 4.14–5.80)
RDW: 12.8 % (ref 11.6–15.4)
WBC: 5 10*3/uL (ref 3.4–10.8)

## 2018-06-27 LAB — THYROID PANEL WITH TSH
FREE THYROXINE INDEX: 2 (ref 1.2–4.9)
T3 Uptake Ratio: 29 % (ref 24–39)
T4, Total: 6.9 ug/dL (ref 4.5–12.0)
TSH: 1.1 u[IU]/mL (ref 0.450–4.500)

## 2018-06-27 LAB — HIV ANTIBODY (ROUTINE TESTING W REFLEX): HIV Screen 4th Generation wRfx: NONREACTIVE

## 2018-06-27 LAB — LIPID PANEL
CHOLESTEROL TOTAL: 212 mg/dL — AB (ref 100–199)
Chol/HDL Ratio: 4.3 ratio (ref 0.0–5.0)
HDL: 49 mg/dL (ref >39)
LDL Calculated: 129 mg/dL — ABNORMAL HIGH (ref 0–99)
TRIGLYCERIDES: 170 mg/dL — AB (ref 0–149)
VLDL Cholesterol Cal: 34 mg/dL (ref 5–40)

## 2018-06-27 LAB — MICROALBUMIN / CREATININE URINE RATIO
Creatinine, Urine: 139.6 mg/dL
MICROALB/CREAT RATIO: 3 mg/g{creat} (ref 0–29)
Microalbumin, Urine: 3.5 ug/mL

## 2018-06-30 ENCOUNTER — Telehealth: Payer: Self-pay | Admitting: Family Medicine

## 2018-06-30 NOTE — Telephone Encounter (Signed)
A1C has not resulted. Could you contact [facility] to check on this for me.

## 2018-07-01 DIAGNOSIS — R1033 Periumbilical pain: Secondary | ICD-10-CM | POA: Insufficient documentation

## 2018-07-02 NOTE — Telephone Encounter (Signed)
[facility] states that A1C wasn't listed on the requisition that was sent with the blood. They no longer have the specimen. I can call patient to schedule a lab appointment prior to his next visit with you in April if you'd like.

## 2018-07-02 NOTE — Addendum Note (Signed)
Addended by: Bing Neighbors on: 07/02/2018 08:29 AM   Modules accepted: Orders

## 2018-07-02 NOTE — Telephone Encounter (Signed)
I can collect once he returns during his next appointment. Advise patient that the test was unable to be completed with his other labs. Other lab results were as follows:  Renal, liver function, thyroid function was normal. Cholesterol was elevated-total cholesterol, triglycerides, and bad cholesterol.  Presently, I recommend lifestyle changes such as engaging in routine physical activity with a goal of 150 minutes per week, increasing intake of vegetables, fruits, fiber, and selecting lean cuts of meat.  I will recheck cholesterol and A1C at his follow-up. If cholesterol remains abnormal, I recommend we discuss low dose statin therapy.

## 2018-07-03 ENCOUNTER — Ambulatory Visit: Payer: Self-pay | Attending: Family Medicine

## 2018-07-03 NOTE — Telephone Encounter (Signed)
Left voice mail to call back 

## 2018-07-06 ENCOUNTER — Telehealth: Payer: Self-pay | Admitting: Family Medicine

## 2018-07-06 ENCOUNTER — Ambulatory Visit (HOSPITAL_COMMUNITY)
Admission: RE | Admit: 2018-07-06 | Discharge: 2018-07-06 | Disposition: A | Discharge status: Home / Self Care | Payer: Self-pay | Source: Ambulatory Visit | Attending: Family Medicine | Admitting: Family Medicine

## 2018-07-06 ENCOUNTER — Encounter (HOSPITAL_COMMUNITY): Payer: Self-pay

## 2018-07-06 ENCOUNTER — Ambulatory Visit: Payer: Self-pay | Admitting: Nurse Practitioner

## 2018-07-06 DIAGNOSIS — R1033 Periumbilical pain: Secondary | ICD-10-CM | POA: Insufficient documentation

## 2018-07-06 DIAGNOSIS — K429 Umbilical hernia without obstruction or gangrene: Secondary | ICD-10-CM

## 2018-07-06 MED ORDER — IOHEXOL 300 MG/ML  SOLN
100.0000 mL | Freq: Once | INTRAMUSCULAR | Status: AC | PRN
  Administered 2018-07-06: 100 mL via INTRAVENOUS

## 2018-07-06 MED ORDER — SODIUM CHLORIDE (PF) 0.9 % IJ SOLN
INTRAMUSCULAR | Status: AC
  Filled 2018-07-06: qty 50

## 2018-07-06 NOTE — Telephone Encounter (Signed)
Notify patient via an interpreter that his recent CT of the abdomen confirmed a peri-umbilical fat-containing hernia.  The hernia is not incarcerated not causing any type of abdominal obstruction.  However I will proceed with referring him to general surgery. I am placing a referral to [facility]. As previously discussed, he will likely have to pay for visit out of pocket. I am not aware of General Surgery accepting [facility] Financial Assistance.

## 2018-07-07 NOTE — Telephone Encounter (Signed)
Patient requested information on what foods to limit/avoid to help with cholesterol. Mailed patient a handout from [facility].

## 2018-07-07 NOTE — Telephone Encounter (Signed)
Patient notified of lab results & recommendations. Expressed understanding. Will come to his appointment in April fasting.

## 2018-07-07 NOTE — Telephone Encounter (Signed)
Patient notified of CT results & recommendations. Patient is scheduled on 07/20/2018 @ 9:30 AM.

## 2018-07-20 ENCOUNTER — Encounter: Payer: Self-pay | Admitting: *Deleted

## 2018-07-20 ENCOUNTER — Ambulatory Visit (INDEPENDENT_AMBULATORY_CARE_PROVIDER_SITE_OTHER): Payer: Self-pay | Admitting: Surgery

## 2018-07-20 ENCOUNTER — Encounter: Payer: Self-pay | Admitting: Surgery

## 2018-07-20 ENCOUNTER — Other Ambulatory Visit: Payer: Self-pay

## 2018-07-20 VITALS — BP 118/75 | HR 70 | Temp 97.7°F | Wt 223.0 lb

## 2018-07-20 DIAGNOSIS — K429 Umbilical hernia without obstruction or gangrene: Secondary | ICD-10-CM

## 2018-07-20 NOTE — Progress Notes (Signed)
Patient's surgery to be scheduled for 08-13-18 at [facility] with Dr. Everlene Farrier. *The patient will require a Spanish interpreter.   The patient is aware he will need to Pre-Admit. [facility] will [facility], [address] (first floor). Patient will be contacted once date/time arranged.   The patient is aware to call the office should he have further questions.

## 2018-07-20 NOTE — Patient Instructions (Signed)
Hernia umbilical en los adultos  Umbilical Hernia, Adult    Una hernia es un bulto de tejido que sobresale a través de una abertura entre los músculos. Una hernia umbilical se produce en el abdomen, cerca del ombligo. La hernia puede contener tejidos del intestino delgado, el intestino grueso o tejido graso que recubre el intestino (epiplón). Las hernias umbilicales en los adultos suelen empeorar con el tiempo y requieren tratamiento quirúrgico.  Hay varios tipos de hernias umbilicales. Es posible que tenga lo siguiente:  · Una hernia ubicada justo por debajo o por arriba del ombligo (hernia indirecta). Es el tipo de hernia umbilical más frecuente en los adultos.  · Una hernia que se forma a través de una abertura hecha por el ombligo (hernia directa).  · Una hernia que aparece y desaparece (hernia reducible). Una hernia reducible puede ser visible solo al hacer fuerza, levantar objetos pesados o toser. Este tipo de hernia se puede reintroducir en el abdomen (reducir).  · Una hernia que aprisiona tejido abdominal (hernia encarcelada). Este tipo de hernia es irreducible.  · Una hernia que interrumpe el flujo de sangre a los tejidos en su interior (hernia estrangulada). Si esto ocurre, los tejidos pueden empezar a morir. Este tipo de hernia requiere tratamiento de emergencia.  ¿Cuáles son las causas?  Una hernia umbilical se produce cuando el tejido dentro del abdomen ejerce presión sobre una zona debilitada de los músculos abdominales.  ¿Qué incrementa el riesgo?  Puede correr un mayor riesgo de tener esta afección en los siguientes casos:  · Tiene obesidad.  · Tuvo varios embarazos.  · Tiene una acumulación de líquido dentro del abdomen (ascitis).  · Se sometió a una cirugía que debilita los músculos abdominales.  ¿Cuáles son los signos o síntomas?  El principal síntoma de esta afección es un bulto en el ombligo o cerca de este que no causa dolor. Una hernia reducible puede ser visible solo al hacer fuerza, levantar  objetos pesados o toser. Otros síntomas pueden incluir lo siguiente:  · Dolor sordo.  · Sensación de opresión.  Los síntomas de una hernia estrangulada pueden incluir los siguientes:  · Dolor que se vuelve cada vez más intenso.  · Náuseas y vómitos.  · Dolor al ejercer presión sobre la hernia.  · Cambio de color de la piel que recubre la hernia que se torna roja o violácea.  · Estreñimiento.  · Sangre en las heces.  ¿Cómo se diagnostica?  Esta afección se puede diagnosticar en función de lo siguiente:  · Un examen físico. Pueden pedirle que tosa o que haga fuerza mientras está de pie. Estas acciones aumentan la presión dentro del abdomen y empujan la hernia a través de la abertura en los músculos. El médico puede ejercer presión sobre la hernia para tratar de reducirla.  · Los síntomas y los antecedentes médicos.  ¿Cómo se trata?  La cirugía es el único tratamiento para una hernia umbilical. En el caso de que la hernia esté estrangulada, esta se realiza lo antes posible. Si tiene una hernia pequeña que no está encarcelada, tal vez tenga que bajar de peso antes de la cirugía.  Siga estas indicaciones en su casa:  · Baje de peso, si se lo indicó el médico.  · No trate de reintroducir la hernia.  · Observe si la hernia cambia de color o de tamaño. Infórmele al médico si se producen cambios.  · Tal vez deba evitar las actividades que aumentan la presión sobre la hernia.  ·   No levante objetos que pesen más de 10 libras (4.5 kg) hasta que el médico le diga que es seguro.  · Tome los medicamentos de venta libre y los recetados solamente como se lo haya indicado el médico.  · Concurra a todas las visitas de control como se lo haya indicado el médico. Esto es importante.  Comuníquese con un médico si:  · La hernia se agranda.  · La hernia le causa dolor.  Solicite ayuda de inmediato si:  · Tiene un dolor intenso y repentino cerca de la zona de la hernia.  · Tiene dolor, así como náuseas o vómitos.  · Tiene dolor y la piel  que recubre la hernia cambia de color.  · Presenta fiebre.  Esta información no tiene como fin reemplazar el consejo del médico. Asegúrese de hacerle al médico cualquier pregunta que tenga.  Document Released: 01/25/2016 Document Revised: 09/01/2017 Document Reviewed: 02/24/2017  Elsevier Interactive Patient Education © 2019 Elsevier Inc.

## 2018-07-21 NOTE — Progress Notes (Signed)
Patient ID: Patrick Diaz, male   DOB: 01/08/1976, 42 y.o.   MRN: [id number]  HPI Patrick Diaz is a 42 y.o. male in consultation by Mrs. Kimberly Harris FNP.  He reports evidence of an umbilical hernia that has been increased in size for the last couple of years.  He does have intermittent pain worsening with Valsalva.  His pain is moderate, sharp.  No previous abdominal operations before no previous hernia repairs before.  He is able to perform more than 4 METS of activity without any shortness of breath or chest pain.  Have a recent CT scan of the abdomen and pelvis that I have personally reviewed evidence of a large umbilical hernia measuring approximately 3 to 4 cm in size. No Evidence of incarceration or strangulation.  Does have some hepatic acidosis.  Does have a normal CMP and CBC.    HPI  History reviewed. No pertinent past medical history.  Past Surgical History:  Procedure Laterality Date  . CYST EXCISION      Family History  Problem Relation Age of Onset  . Cancer Mother   . Healthy Father   . Diabetes Neg Hx   . Hyperlipidemia Neg Hx   . Hypertension Neg Hx     Social History Social History   Tobacco Use  . Smoking status: Never Smoker  . Smokeless tobacco: Never Used  Substance Use Topics  . Alcohol use: No    Alcohol/week: 0.0 standard drinks  . Drug use: No    No Known Allergies  No current outpatient medications on file.   No current facility-administered medications for this visit.      Review of Systems Full ROS  was asked and was negative except for the information on the HPI  Physical Exam Blood pressure 118/75, pulse 70, temperature 97.7 F (36.5 C), temperature source Skin, weight 223 lb (101.2 kg), SpO2 98 %. CONSTITUTIONAL: NAD EYES: Pupils are equal, round, and reactive to light, Sclera are non-icteric. EARS, NOSE, MOUTH AND THROAT: The oropharynx is clear. The oral mucosa is pink and moist. Hearing is intact to voice. LYMPH  NODES:  Lymph nodes in the neck are normal. RESPIRATORY:  Lungs are clear. There is normal respiratory effort, with equal breath sounds bilaterally, and without pathologic use of accessory muscles. CARDIOVASCULAR: Heart is regular without murmurs, gallops, or rubs. GI: The abdomen is soft,  LArge reducible UH measuring apr 3-4 cms hernia defect.  There are no palpable masses. There is no hepatosplenomegaly. There are normal bowel sounds in all quadrants. GU: Rectal deferred.   MUSCULOSKELETAL: Normal muscle strength and tone. No cyanosis or edema.   SKIN: Turgor is good and there are no pathologic skin lesions or ulcers. NEUROLOGIC: Motor and sensation is grossly normal. Cranial nerves are grossly intact. PSYCH:  Oriented to person, place and time. Affect is normal.  Data Reviewed  I have personally reviewed the patient's imaging, laboratory findings and medical records.    Assessment/Plan 42-year-old male with a large symptomatic umbilical hernia.  Discussed with the patient in detail about his disease process and I do definitely recommend repair.  I do think that he is a great candidate for robotic assisted laparoscopic ventral hernia repair.  Procedure discussed with the patient detail.  Risk, benefits and possible complications including but not limited to: Bleeding, infection, injury to adjacent structures, bowel injury and recurrence.  Diego Pabon, MD FACS General Surgeon 07/21/2018, 12:20 PM   

## 2018-07-21 NOTE — H&P (View-Only) (Signed)
Patient ID: Patrick Diaz, male   DOB: 10-Dec-1975, 43 y.o.   MRN: [id number]  HPI Patrick Diaz is a 43 y.o. male in consultation by Mrs. Patrick Courts FNP.  He reports evidence of an umbilical hernia that has been increased in size for the last couple of years.  He does have intermittent pain worsening with Valsalva.  His pain is moderate, sharp.  No previous abdominal operations before no previous hernia repairs before.  He is able to perform more than 4 METS of activity without any shortness of breath or chest pain.  Have a recent CT scan of the abdomen and pelvis that I have personally reviewed evidence of a large umbilical hernia measuring approximately 3 to 4 cm in size. No Evidence of incarceration or strangulation.  Does have some hepatic acidosis.  Does have a normal CMP and CBC.    HPI  History reviewed. No pertinent past medical history.  Past Surgical History:  Procedure Laterality Date  . CYST EXCISION      Family History  Problem Relation Age of Onset  . Cancer Mother   . Healthy Father   . Diabetes Neg Hx   . Hyperlipidemia Neg Hx   . Hypertension Neg Hx     Social History Social History   Tobacco Use  . Smoking status: Never Smoker  . Smokeless tobacco: Never Used  Substance Use Topics  . Alcohol use: No    Alcohol/week: 0.0 standard drinks  . Drug use: No    No Known Allergies  No current outpatient medications on file.   No current facility-administered medications for this visit.      Review of Systems Full ROS  was asked and was negative except for the information on the HPI  Physical Exam Blood pressure 118/75, pulse 70, temperature 97.7 F (36.5 C), temperature source Skin, weight 223 lb (101.2 kg), SpO2 98 %. CONSTITUTIONAL: NAD EYES: Pupils are equal, round, and reactive to light, Sclera are non-icteric. EARS, NOSE, MOUTH AND THROAT: The oropharynx is clear. The oral mucosa is pink and moist. Hearing is intact to voice. LYMPH  NODES:  Lymph nodes in the neck are normal. RESPIRATORY:  Lungs are clear. There is normal respiratory effort, with equal breath sounds bilaterally, and without pathologic use of accessory muscles. CARDIOVASCULAR: Heart is regular without murmurs, gallops, or rubs. GI: The abdomen is soft,  LArge reducible UH measuring apr 3-4 cms hernia defect.  There are no palpable masses. There is no hepatosplenomegaly. There are normal bowel sounds in all quadrants. GU: Rectal deferred.   MUSCULOSKELETAL: Normal muscle strength and tone. No cyanosis or edema.   SKIN: Turgor is good and there are no pathologic skin lesions or ulcers. NEUROLOGIC: Motor and sensation is grossly normal. Cranial nerves are grossly intact. PSYCH:  Oriented to person, place and time. Affect is normal.  Data Reviewed  I have personally reviewed the patient's imaging, laboratory findings and medical records.    Assessment/Plan 43 year old male with a large symptomatic umbilical hernia.  Discussed with the patient in detail about his disease process and I do definitely recommend repair.  I do think that he is a great candidate for robotic assisted laparoscopic ventral hernia repair.  Procedure discussed with the patient detail.  Risk, benefits and possible complications including but not limited to: Bleeding, infection, injury to adjacent structures, bowel injury and recurrence.  Sterling Big, MD FACS General Surgeon 07/21/2018, 12:20 PM

## 2018-07-22 ENCOUNTER — Telehealth: Payer: Self-pay | Admitting: *Deleted

## 2018-07-22 NOTE — Telephone Encounter (Signed)
Called patient with the help of [place] interpreter, Janelle Floor ID# [phone] who left message for patient to call me back at the office since he was unavailable.   We need to inform patient of Pre-admit appointment scheduled for 08-06-18 at 8 am.

## 2018-07-23 ENCOUNTER — Telehealth: Payer: Self-pay | Admitting: *Deleted

## 2018-07-23 NOTE — Telephone Encounter (Signed)
I did call and speak with [place] Spanish interpreter, Chipper Oman ID# [phone] today.   She did leave a message for patient informing him of Pre-admit appointment scheduled for 08-06-18 at 8 am.

## 2018-08-06 ENCOUNTER — Other Ambulatory Visit: Payer: Self-pay

## 2018-08-06 ENCOUNTER — Encounter
Admission: RE | Admit: 2018-08-06 | Discharge: 2018-08-06 | Disposition: A | Discharge status: Home / Self Care | Payer: Self-pay | Source: Ambulatory Visit | Attending: Surgery | Admitting: Surgery

## 2018-08-06 DIAGNOSIS — Z01812 Encounter for preprocedural laboratory examination: Secondary | ICD-10-CM | POA: Insufficient documentation

## 2018-08-06 HISTORY — DX: Other specified health status: Z78.9

## 2018-08-06 NOTE — Patient Instructions (Signed)
Your procedure is scheduled on: Thursday 08/13/18 Su procedimiento est programado para: Report to Medical Mall Entrance to the 2nd floor surgery desk Presntese a: To find out your arrival time please call [phone] between 1PM - 3PM on Wednesday 08/12/18. Para saber su hora de llegada por favor llame al [phone] entre la 1PM - 3PM el da:  Remember: Instructions that are not followed completely may result in serious medical risk, up to and including death, or upon the discretion of your surgeon and anesthesiologist your surgery may need to be rescheduled.  Recuerde: Las instrucciones que no se siguen completamente Armed forces logistics/support/administrative officer en un riesgo de salud grave, incluyendo hasta la [place] o a discrecin de su cirujano y Scientific laboratory technician, su ciruga se puede posponer.   __X__ 1. Do not eat food or drink liquids after midnight. No gum chewing or hard candies.  No coma alimentos ni tome lquidos despus de la medianoche.  No mastique chicle ni caramelos  duros.     __X__ 2. No alcohol for 24 hours before or after surgery.    No tome alcohol durante las 24 horas antes ni despus de la [place].   ____ 3. Bring all medications with you on the day of surgery if instructed.    Lleve todos los medicamentos con usted el da de su ciruga si se le ha indicado as.   __X__ 4. Notify your doctor if there is any change in your medical condition (cold, fever,                             infections).    Informe a su mdico si hay algn cambio en su condicin mdica (resfriado, fiebre, infecciones).   Do not wear jewelry, make-up, hairpins, clips or nail polish.  No use joyas, maquillajes, pinzas/ganchos para el cabello ni esmalte de uas.  Do not wear lotions, powders, or perfumes. .  No use lociones, polvos o perfumes.  .    Do not shave 48 hours prior to surgery. Men may shave face and neck.  No se afeite 48 horas antes de la [place].  Los hombres pueden [employer] cara y el cuello.   Do not  bring valuables to the hospital.   No lleve objetos de valor al hospital.  [facility] is not responsible for any belongings or valuables.  [place] no se hace responsable de ningn tipo de pertenencias u objetos de Licensed conveyancer.               Contacts, dentures or bridgework may not be worn into surgery.  Los lentes de Pulpotio Bareas, las dentaduras postizas o puentes no se pueden usar en la [place].  Leave your suitcase in the car. After surgery it may be brought to your room.  Deje su maleta en el auto.  Despus de la ciruga podr traerla a su habitacin.  For patients admitted to the hospital, discharge time is determined by your treatment team.  Para los pacientes que sean ingresados al hospital, el tiempo en el cual se le dar de alta es determinado por su                equipo de [place].   Patients discharged the day of surgery will not be allowed to drive home. A los pacientes que se les da de alta el mismo da de la ciruga no se les permitir conducir a Higher education careers adviser.   Please read over the following fact  sheets that you were given: Por favor lea las siguientes hojas de informacin que le dieron:   MRSA Information   ____ Take these medicines the morning of surgery with A SIP OF WATER:          Tome estas medicinas la maana de la ciruga con UN SORBO DE AGUA:  1.   2.   3.   4.       5.  6.  ____ Fleet Enema (as directed)          Enema de Fleet (segn lo indicado)    __X__ Use CHG Soap as directed          Utilice el jabn de CHG segn lo indicado  ____ Use inhalers on the day of surgery          Use los inhaladores el da de la ciruga  ____ Stop metformin 2 days prior to surgery          Deje de tomar el metformin 2 das antes de la ciruga    ____ Take 1/2 of usual insulin dose the night before surgery and none on the morning of surgery           Tome la mitad de la dosis habitual de insulina la noche antes de la [place] y no tome nada en la maana de la              ciruga  ____ Stop Coumadin/Plavix/aspirin on           Deje de tomar el Coumadin/Plavix/aspirina el da:  __X__ Stop Anti-inflammatories on TODAY NO ADVIL IBUPROFEN ALEVE ASPIRIN          Deje de tomar antiinflamatorios el da:   ____ Stop supplements until after surgery            Deje de tomar suplementos hasta despus de la ciruga  ____ Bring C-Pap to the hospital          Lleve el C-Pap al hospital

## 2018-08-12 MED ORDER — CEFAZOLIN SODIUM-DEXTROSE 2-4 GM/100ML-% IV SOLN: 2.0000 g | INTRAVENOUS | Status: DC

## 2018-08-13 ENCOUNTER — Ambulatory Visit: Payer: Self-pay | Admitting: Registered Nurse

## 2018-08-13 ENCOUNTER — Encounter: Payer: Self-pay | Admitting: Surgery

## 2018-08-13 ENCOUNTER — Encounter
Admission: RE | Disposition: A | Discharge status: Home / Self Care | Payer: Self-pay | Source: Home / Self Care | Attending: Surgery

## 2018-08-13 ENCOUNTER — Ambulatory Visit
Admission: RE | Admit: 2018-08-13 | Discharge: 2018-08-13 | Disposition: A | Discharge status: Home / Self Care | Payer: Self-pay | Attending: Surgery | Admitting: Surgery

## 2018-08-13 DIAGNOSIS — K439 Ventral hernia without obstruction or gangrene: Secondary | ICD-10-CM | POA: Insufficient documentation

## 2018-08-13 DIAGNOSIS — K429 Umbilical hernia without obstruction or gangrene: Secondary | ICD-10-CM

## 2018-08-13 HISTORY — PX: ROBOTIC ASSISTED LAPAROSCOPIC VENTRAL/INCISIONAL HERNIA REPAIR: SHX6607

## 2018-08-13 LAB — GLUCOSE, CAPILLARY: Glucose-Capillary: 158 mg/dL — ABNORMAL HIGH (ref 70–99)

## 2018-08-13 SURGERY — ROBOTIC ASSISTED LAPAROSCOPIC VENTRAL/INCISIONAL HERNIA REPAIR
Anesthesia: General | Site: Abdomen

## 2018-08-13 MED ORDER — CELECOXIB 200 MG PO CAPS
ORAL_CAPSULE | ORAL | Status: AC
  Administered 2018-08-13: 200 mg via ORAL
  Filled 2018-08-13: qty 1

## 2018-08-13 MED ORDER — CELECOXIB 200 MG PO CAPS
200.0000 mg | ORAL_CAPSULE | ORAL | Status: AC
  Administered 2018-08-13: 200 mg via ORAL

## 2018-08-13 MED ORDER — BUPIVACAINE-EPINEPHRINE (PF) 0.25% -1:200000 IJ SOLN
INTRAMUSCULAR | Status: AC
  Filled 2018-08-13: qty 30

## 2018-08-13 MED ORDER — FENTANYL CITRATE (PF) 250 MCG/5ML IJ SOLN
INTRAMUSCULAR | Status: AC
  Filled 2018-08-13: qty 5

## 2018-08-13 MED ORDER — PROPOFOL 10 MG/ML IV BOLUS
INTRAVENOUS | Status: DC | PRN
  Administered 2018-08-13: 150 mg via INTRAVENOUS

## 2018-08-13 MED ORDER — HYDROCODONE-ACETAMINOPHEN 5-325 MG PO TABS
ORAL_TABLET | ORAL | Status: AC
  Filled 2018-08-13: qty 1

## 2018-08-13 MED ORDER — GLYCOPYRROLATE 0.2 MG/ML IJ SOLN
INTRAMUSCULAR | Status: AC
  Filled 2018-08-13: qty 1

## 2018-08-13 MED ORDER — CHLORHEXIDINE GLUCONATE CLOTH 2 % EX PADS: 6.0000 | MEDICATED_PAD | Freq: Once | CUTANEOUS | Status: DC

## 2018-08-13 MED ORDER — FAMOTIDINE 20 MG PO TABS
ORAL_TABLET | ORAL | Status: AC
  Administered 2018-08-13: 20 mg via ORAL
  Filled 2018-08-13: qty 1

## 2018-08-13 MED ORDER — ONDANSETRON HCL 4 MG/2ML IJ SOLN
INTRAMUSCULAR | Status: AC
  Administered 2018-08-13: 4 mg via INTRAVENOUS
  Filled 2018-08-13: qty 2

## 2018-08-13 MED ORDER — GABAPENTIN 300 MG PO CAPS
300.0000 mg | ORAL_CAPSULE | ORAL | Status: AC
  Administered 2018-08-13: 300 mg via ORAL

## 2018-08-13 MED ORDER — ROCURONIUM BROMIDE 50 MG/5ML IV SOLN
INTRAVENOUS | Status: AC
  Filled 2018-08-13: qty 1

## 2018-08-13 MED ORDER — ONDANSETRON HCL 4 MG/2ML IJ SOLN
4.0000 mg | Freq: Once | INTRAMUSCULAR | Status: AC | PRN
  Administered 2018-08-13: 4 mg via INTRAVENOUS

## 2018-08-13 MED ORDER — PROPOFOL 10 MG/ML IV BOLUS
INTRAVENOUS | Status: AC
  Filled 2018-08-13: qty 20

## 2018-08-13 MED ORDER — MIDAZOLAM HCL 2 MG/2ML IJ SOLN
INTRAMUSCULAR | Status: DC | PRN
  Administered 2018-08-13: 2 mg via INTRAVENOUS

## 2018-08-13 MED ORDER — FENTANYL CITRATE (PF) 100 MCG/2ML IJ SOLN
INTRAMUSCULAR | Status: DC | PRN
  Administered 2018-08-13: 50 ug via INTRAVENOUS
  Administered 2018-08-13: 100 ug via INTRAVENOUS

## 2018-08-13 MED ORDER — DEXAMETHASONE SODIUM PHOSPHATE 10 MG/ML IJ SOLN
INTRAMUSCULAR | Status: DC | PRN
  Administered 2018-08-13: 4 mg via INTRAVENOUS

## 2018-08-13 MED ORDER — FENTANYL CITRATE (PF) 100 MCG/2ML IJ SOLN
INTRAMUSCULAR | Status: AC
  Filled 2018-08-13: qty 2

## 2018-08-13 MED ORDER — FENTANYL CITRATE (PF) 100 MCG/2ML IJ SOLN: 25.0000 ug | INTRAMUSCULAR | Status: DC | PRN

## 2018-08-13 MED ORDER — ONDANSETRON HCL 4 MG/2ML IJ SOLN
INTRAMUSCULAR | Status: AC
  Filled 2018-08-13: qty 2

## 2018-08-13 MED ORDER — ONDANSETRON HCL 4 MG/2ML IJ SOLN
INTRAMUSCULAR | Status: DC | PRN
  Administered 2018-08-13: 4 mg via INTRAVENOUS

## 2018-08-13 MED ORDER — ACETAMINOPHEN 500 MG PO TABS
1000.0000 mg | ORAL_TABLET | ORAL | Status: AC
  Administered 2018-08-13: 1000 mg via ORAL

## 2018-08-13 MED ORDER — MIDAZOLAM HCL 2 MG/2ML IJ SOLN
INTRAMUSCULAR | Status: AC
  Filled 2018-08-13: qty 2

## 2018-08-13 MED ORDER — BUPIVACAINE-EPINEPHRINE 0.25% -1:200000 IJ SOLN
INTRAMUSCULAR | Status: DC | PRN
  Administered 2018-08-13: 30 mL

## 2018-08-13 MED ORDER — HYDROCODONE-ACETAMINOPHEN 5-325 MG PO TABS
1.0000 | ORAL_TABLET | Freq: Once | ORAL | Status: AC
  Administered 2018-08-13: 1 via ORAL

## 2018-08-13 MED ORDER — LIDOCAINE HCL (PF) 2 % IJ SOLN
INTRAMUSCULAR | Status: AC
  Filled 2018-08-13: qty 10

## 2018-08-13 MED ORDER — LIDOCAINE HCL (CARDIAC) PF 100 MG/5ML IV SOSY
PREFILLED_SYRINGE | INTRAVENOUS | Status: DC | PRN
  Administered 2018-08-13: 100 mg via INTRAVENOUS

## 2018-08-13 MED ORDER — PHENYLEPHRINE HCL 10 MG/ML IJ SOLN
INTRAMUSCULAR | Status: AC
  Filled 2018-08-13: qty 1

## 2018-08-13 MED ORDER — GABAPENTIN 300 MG PO CAPS
ORAL_CAPSULE | ORAL | Status: AC
  Administered 2018-08-13: 300 mg via ORAL
  Filled 2018-08-13: qty 1

## 2018-08-13 MED ORDER — DEXAMETHASONE SODIUM PHOSPHATE 4 MG/ML IJ SOLN
INTRAMUSCULAR | Status: AC
  Filled 2018-08-13: qty 1

## 2018-08-13 MED ORDER — SUCCINYLCHOLINE CHLORIDE 20 MG/ML IJ SOLN
INTRAMUSCULAR | Status: AC
  Filled 2018-08-13: qty 1

## 2018-08-13 MED ORDER — LACTATED RINGERS IV SOLN
INTRAVENOUS | Status: DC
  Administered 2018-08-13: 07:00:00 via INTRAVENOUS

## 2018-08-13 MED ORDER — ACETAMINOPHEN 500 MG PO TABS
ORAL_TABLET | ORAL | Status: AC
  Administered 2018-08-13: 1000 mg via ORAL
  Filled 2018-08-13: qty 2

## 2018-08-13 MED ORDER — PHENYLEPHRINE HCL 10 MG/ML IJ SOLN
INTRAMUSCULAR | Status: DC | PRN
  Administered 2018-08-13: 100 ug via INTRAVENOUS

## 2018-08-13 MED ORDER — CEFAZOLIN SODIUM-DEXTROSE 2-4 GM/100ML-% IV SOLN
INTRAVENOUS | Status: AC
  Filled 2018-08-13: qty 100

## 2018-08-13 MED ORDER — EPHEDRINE SULFATE 50 MG/ML IJ SOLN
INTRAMUSCULAR | Status: AC
  Filled 2018-08-13: qty 1

## 2018-08-13 MED ORDER — ROCURONIUM BROMIDE 100 MG/10ML IV SOLN
INTRAVENOUS | Status: DC | PRN
  Administered 2018-08-13: 20 mg via INTRAVENOUS
  Administered 2018-08-13: 50 mg via INTRAVENOUS

## 2018-08-13 MED ORDER — FAMOTIDINE 20 MG PO TABS
20.0000 mg | ORAL_TABLET | Freq: Once | ORAL | Status: AC
  Administered 2018-08-13: 20 mg via ORAL

## 2018-08-13 SURGICAL SUPPLY — 50 items
ADH SKN CLS APL DERMABOND .7 (GAUZE/BANDAGES/DRESSINGS) ×2
APL PRP STRL LF DISP 70% ISPRP (MISCELLANEOUS) ×1
BLADE CLIPPER SURG (BLADE) ×1 IMPLANT
BLADE SURG SZ11 CARB STEEL (BLADE) ×2 IMPLANT
CANISTER SUCT 1200ML W/VALVE (MISCELLANEOUS) ×2 IMPLANT
CANNULA SEALS 8.5MM (CANNULA) ×1
CHLORAPREP W/TINT 26 (MISCELLANEOUS) ×2 IMPLANT
CORD BIP STRL DISP 12FT (MISCELLANEOUS) ×1 IMPLANT
COVER TIP SHEARS 8 DVNC (MISCELLANEOUS) IMPLANT
COVER TIP SHEARS 8MM DA VINCI (MISCELLANEOUS) ×2
COVER WAND RF STERILE (DRAPES) ×2 IMPLANT
DEFOGGER SCOPE WARMER CLEARIFY (MISCELLANEOUS) ×2 IMPLANT
DERMABOND ADVANCED (GAUZE/BANDAGES/DRESSINGS) ×2
DERMABOND ADVANCED .7 DNX12 (GAUZE/BANDAGES/DRESSINGS) IMPLANT
DRAPE 3 ARM ACCESS DA VINCI (DRAPES) ×1
DRAPE 3 ARM ACCESS DVNC (DRAPES) ×1 IMPLANT
DRAPE SHEET LG 3/4 BI-LAMINATE (DRAPES) ×2 IMPLANT
ELECT CAUTERY BLADE 6.4 (BLADE) ×2 IMPLANT
ELECT REM PT RETURN 9FT ADLT (ELECTROSURGICAL) ×2
ELECTRODE REM PT RTRN 9FT ADLT (ELECTROSURGICAL) ×1 IMPLANT
GLOVE BIO SURGEON STRL SZ7 (GLOVE) ×10 IMPLANT
GOWN STRL REUS W/ TWL LRG LVL3 (GOWN DISPOSABLE) ×3 IMPLANT
GOWN STRL REUS W/TWL LRG LVL3 (GOWN DISPOSABLE) ×10
GRASPER SUT TROCAR 14GX15 (MISCELLANEOUS) ×2 IMPLANT
IV NS 1000ML (IV SOLUTION)
IV NS 1000ML BAXH (IV SOLUTION) ×1 IMPLANT
KIT PINK PAD W/HEAD ARE REST (MISCELLANEOUS) ×2
KIT PINK PAD W/HEAD ARM REST (MISCELLANEOUS) ×1 IMPLANT
LABEL OR SOLS (LABEL) ×2 IMPLANT
MESH VENT LT ST 11.4CM CRL (Mesh General) ×1 IMPLANT
NEEDLE HYPO 22GX1.5 SAFETY (NEEDLE) ×2 IMPLANT
PACK LAP CHOLECYSTECTOMY (MISCELLANEOUS) ×2 IMPLANT
PENCIL ELECTRO HAND CTR (MISCELLANEOUS) ×2 IMPLANT
PROGRASP ENDOWRIST DA VINCI (INSTRUMENTS) ×1
PROGRASP ENDOWRIST DVNC (INSTRUMENTS) ×1 IMPLANT
SEAL CANN 8.5 DVNC (CANNULA) ×1 IMPLANT
SOLUTION ELECTROLUBE (MISCELLANEOUS) ×2 IMPLANT
SPONGE LAP 18X18 RF (DISPOSABLE) ×2 IMPLANT
STRAP SAFETY 5IN WIDE (MISCELLANEOUS) ×2 IMPLANT
SUT DVC VLOC 180 0 12IN GS21 (SUTURE) ×2
SUT MNCRL 4-0 (SUTURE) ×4
SUT MNCRL 4-0 27XMFL (SUTURE) ×2
SUT VICRYL 0 AB UR-6 (SUTURE) ×4 IMPLANT
SUT VLOC 90 2/L VL 12 GS22 (SUTURE) ×3 IMPLANT
SUTURE DVC VLC 180 0 12IN GS21 (SUTURE) ×2 IMPLANT
SUTURE MNCRL 4-0 27XMF (SUTURE) ×1 IMPLANT
TROCAR 130MM GELPORT  DAV (MISCELLANEOUS) ×2 IMPLANT
TROCAR DISP BLADELESS 8 DVNC (TROCAR) IMPLANT
TROCAR DISP BLADELESS 8MM (TROCAR) ×2
TUBING EVAC SMOKE HEATED PNEUM (TUBING) ×2 IMPLANT

## 2018-08-13 NOTE — Anesthesia Post-op Follow-up Note (Signed)
Anesthesia QCDR form completed.        

## 2018-08-13 NOTE — Discharge Instructions (Addendum)
Laparoscopic Ventral Hernia Repair, Care After °This sheet gives you information about how to care for yourself after your procedure. Your health care provider may also give you more specific instructions. If you have problems or questions, contact your health care provider. °What can I expect after the procedure? °After the procedure, it is common to have: °· Pain, discomfort, or soreness. °Follow these instructions at home: °Incision care ° °· Follow instructions from your health care provider about how to take care of your incision. Make sure you: °? Wash your hands with soap and water before you change your bandage (dressing) or before you touch your abdomen. If soap and water are not available, use hand sanitizer. °? Change your dressing as told by your health care provider. °? Leave stitches (sutures), skin glue, or adhesive strips in place. These skin closures may need to stay in place for 2 weeks or longer. If adhesive strip edges start to loosen and curl up, you may trim the loose edges. Do not remove adhesive strips completely unless your health care provider tells you to do that. °· Check your incision area every day for signs of infection. Check for: °? Redness, swelling, or pain. °? Fluid or blood. °? Warmth. °? Pus or a bad smell. °Bathing ° °· Do not take baths, swim, or use a hot tub until your health care provider approves. Ask your health care provider if you can take showers. You may only be allowed to take sponge baths for bathing. °· Keep your bandage (dressing) dry until your health care provider says it can be removed. °Activity °· Do not lift anything that is heavier than 10 lb (4.5 kg) until your health care provider approves. °· Do not drive or use heavy machinery while taking prescription pain medicine. Ask your health care provider when it is safe for you to drive or use heavy machinery. °· Do not drive for 24 hours if you were given a medicine to help you relax (sedative) during your  procedure. °· Rest as told by your health care provider. You may return to your normal activities when your health care provider approves. °General instructions °· Take over-the-counter and prescription medicines only as told by your health care provider. °· To prevent or treat constipation while you are taking prescription pain medicine, your health care provider may recommend that you: °? Take over-the-counter or prescription medicines. °? Eat foods that are high in fiber, such as fresh fruits and vegetables, whole grains, and beans. °? Limit foods that are high in fat and processed sugars, such as fried and sweet foods. °· Drink enough fluid to keep your urine clear or pale yellow. °· Hold a pillow over your abdomen when you cough or sneeze. This helps with pain. °· Keep all follow-up visits as told by your health care provider. This is important. °Contact a health care provider if: °· You have: °? A fever or chills. °? Redness, swelling, or pain around your incision. °? Fluid or blood coming from your incision. °? Pus or a bad smell coming from your incision. °? Pain that gets worse or does not get better with medicine. °? Nausea or vomiting. °? A cough. °? Shortness of breath. °· Your incision feels warm to the touch. °· You have not had a bowel movement in three days. °· You are not able to urinate. °Get help right away if: °· You have severe pain in your abdomen. °· You have persistent nausea and vomiting. °· You have   redness, warmth, or pain in your leg. °· You have chest pain. °· You have trouble breathing. °Summary °· After this procedure, it is common to have pain, discomfort, or soreness. °· Follow instructions from your health care provider about how to take care of your incision. °· Check your incision area every day for signs of infection. Report any signs of infection to your health care provider. °· Keep all follow-up visits as told by your health care provider. This is important. °This information  is not intended to replace advice given to you by your health care provider. Make sure you discuss any questions you have with your health care provider. °Document Released: 05/06/2012 Document Revised: 01/10/2016 Document Reviewed: 01/10/2016 °Elsevier Interactive Patient Education © 2019 [employer] ° °AMBULATORY SURGERY  °DISCHARGE INSTRUCTIONS ° ° °1) The drugs that you were given will stay in your system until tomorrow so for the next 24 hours you should not: ° °A) Drive an automobile °B) Make any legal decisions °C) Drink any alcoholic beverage ° ° °2) You may resume regular meals tomorrow.  Today it is better to start with liquids and gradually work up to solid foods. ° °You may eat anything you prefer, but it is better to start with liquids, then soup and crackers, and gradually work up to solid foods. ° ° °3) Please notify your doctor immediately if you have any unusual bleeding, trouble breathing, redness and pain at the surgery site, drainage, fever, or pain not relieved by medication. ° ° ° °4) Additional Instructions: ° ° ° ° ° ° ° °Please contact your physician with any problems or Same Day Surgery at [phone], Monday through Friday 6 am to 4 pm, or [facility] Main number at [phone]. ° °

## 2018-08-13 NOTE — Op Note (Signed)
Robotic assisted laparoscopic ventral hernia Repair IPOM using  round ventralight BARD mesh   Pre-operative Diagnosis: ventral hernia   Post-operative Diagnosis: same   Surgeon: Sterling Big, MD FACS   Anesthesia: Gen. with endotracheal tube     Findings: Umbilical hernia measuring 3.5 cm, good     Estimated Blood Loss: 5cc         Complications: none             Procedure Details  The patient was seen again in the Holding Room. The benefits, complications, treatment options, and expected outcomes were discussed with the patient. The risks of bleeding, infection, recurrence of symptoms, failure to resolve symptoms, bowel injury, mesh placement, mesh infection, any of which could require further surgery were reviewed with the patient. The likelihood of improving the patient's symptoms with return to their baseline status is good.  The patient and/or family concurred with the proposed plan, giving informed consent.  The patient was taken to Operating Room, identified  and the procedure verified.  A Time Out was held and the above information confirmed.   Prior to the induction of general anesthesia, antibiotic prophylaxis was administered. VTE prophylaxis was in place. General endotracheal anesthesia was then administered and tolerated well. After the induction, the abdomen was prepped with Chloraprep and draped in the sterile fashion. The patient was positioned in the supine position.   We used a left upper quadrant subcostal incision and using a cutdown technique were able to identify the fascia both anteriorly and posteriorly elevated incised and 2 stay sutures were placed.  Hassan trocar inserted and pneumoperitoneum obtained.  No hemodynamic compromise.   2 additional 8 mm ports were placed under direct visualization.  I visualized the hernia and there was an epigastric ventral hernia measuring approximately  3.5 cm.  At this time I went ahead and inserted the 11.4 round mesh with an echo  location.   The robot was brought to the surgical field and docked in the standard fashion.  We made sure that all instrumentation was kept under direct vision at all times and there was no collision between the arms.  I scrubbed out and went to the console.   Confirm and measured that the defect was 3.5 cm.  .  Using a 0V lock suture we closed the ventral defect primarily.  The PMI was used to pierce the defect in the center.  Using the mesh and the echo location device we were able to bring the mesh towards the abdominal wall. Falciform was taken down with electrocautery to allow adequate mesh placement.    The mesh was secured circumferentially to the abdominal wall using 2 OV lock in the standard fashion.   The mesh layed really nicely against the  abdominal wall. A second look laparoscopy revealed no evidence of intra-abdominal injury.    All the needles and foreign objects were removed under direct visualization.  The instruments were removed and the robot was undocked.  I scrubbed back in, The laparoscopic ports were removed under direct visualization and the pneumoperitoneum was deflated.   Incisions were closed with  4-0 Monocryl  And the fascial sutures approximated in the standard fashion Dermabond was used to coat the skin. Marcaine quarter percent with epinephrine and lidocaine 1% was used to inject all the incision sites. Patient tolerated procedure well and there were no immediate complications. Needle and laparotomy counts were correct    Sterling Big, MD, FACS

## 2018-08-13 NOTE — Transfer of Care (Signed)
Immediate Anesthesia Transfer of Care Note  Patient: Patrick Diaz  Procedure(s) Performed: ROBOTIC ASSISTED LAPAROSCOPIC VENTRAL HERNIA REPAIR INTERPRETER APPOINTMENT (N/A Abdomen)  Patient Location: PACU  Anesthesia Type:General  Level of Consciousness: sedated  Airway & Oxygen Therapy: Patient connected to face mask oxygen  Post-op Assessment: Post -op Vital signs reviewed and stable  Post vital signs: stable  Last Vitals:  Vitals Value Taken Time  BP    Temp    Pulse    Resp    SpO2      Last Pain:  Vitals:   08/13/18 1012  TempSrc: Tympanic  PainSc: 3          Complications: No apparent anesthesia complications

## 2018-08-13 NOTE — Anesthesia Postprocedure Evaluation (Signed)
Anesthesia Post Note  Patient: Patrick Diaz  Procedure(s) Performed: ROBOTIC ASSISTED LAPAROSCOPIC VENTRAL HERNIA REPAIR INTERPRETER APPOINTMENT (N/A Abdomen)  Patient location during evaluation: PACU Anesthesia Type: General Level of consciousness: awake and alert Pain management: pain level controlled Vital Signs Assessment: post-procedure vital signs reviewed and stable Respiratory status: spontaneous breathing, nonlabored ventilation, respiratory function stable and patient connected to nasal cannula oxygen Cardiovascular status: blood pressure returned to baseline and stable Postop Assessment: no apparent nausea or vomiting Anesthetic complications: no     Last Vitals:  Vitals:   08/13/18 0621 08/13/18 1012  BP: 138/77 121/69  Pulse: (!) 57 (!) 55  Resp: 18 18  Temp: 37.1 C 36.6 C  SpO2: 100% 98%    Last Pain:  Vitals:   08/13/18 1059  TempSrc:   PainSc: 9                  Yevette Edwards

## 2018-08-13 NOTE — Anesthesia Procedure Notes (Signed)
Procedure Name: Intubation Date/Time: 08/13/2018 7:43 AM Performed by: Yevette Edwards, MD Pre-anesthesia Checklist: Patient identified, Emergency Drugs available, Suction available and Patient being monitored Patient Re-evaluated:Patient Re-evaluated prior to induction Oxygen Delivery Method: Circle system utilized Preoxygenation: Pre-oxygenation with 100% oxygen Induction Type: IV induction Ventilation: Oral airway inserted - appropriate to patient size and Two handed mask ventilation required Laryngoscope Size: McGraph and 4 Grade View: Grade I Tube type: Oral Tube size: 7.5 mm Number of attempts: 2 Airway Equipment and Method: Video-laryngoscopy and Bougie stylet Placement Confirmation: ETT inserted through vocal cords under direct vision,  positive ETCO2 and breath sounds checked- equal and bilateral Secured at: 22 cm Tube secured with: Tape Dental Injury: Teeth and Oropharynx as per pre-operative assessment  Difficulty Due To: Difficulty was anticipated

## 2018-08-13 NOTE — Interval H&P Note (Signed)
History and Physical Interval Note:  08/13/2018 7:20 AM  Patrick Diaz  has presented today for surgery, with the diagnosis of UMBILICAL HERNIA.  The various methods of treatment have been discussed with the patient and family. After consideration of risks, benefits and other options for treatment, the patient has consented to  Procedure(s): ROBOTIC ASSISTED LAPAROSCOPIC VENTRAL HERNIA REPAIR INTERPRETER APPOINTMENT (N/A) as a surgical intervention.  The patient's history has been reviewed, patient examined, no change in status, stable for surgery.  I have reviewed the patient's chart and labs.  Questions were answered to the patient's satisfaction.     Diego F Pabon

## 2018-08-13 NOTE — Anesthesia Preprocedure Evaluation (Signed)
Anesthesia Evaluation  Patient identified by MRN, date of birth, ID band Patient awake    Reviewed: Allergy & Precautions, H&P , NPO status , Patient's Chart, lab work & pertinent test results, reviewed documented beta blocker date and time   Airway Mallampati: II  TM Distance: >3 FB Neck ROM: full    Dental  (+) Teeth Intact   Pulmonary neg pulmonary ROS,    Pulmonary exam normal        Cardiovascular negative cardio ROS Normal cardiovascular exam Rhythm:regular Rate:Normal     Neuro/Psych negative neurological ROS  negative psych ROS   GI/Hepatic negative GI ROS, Neg liver ROS,   Endo/Other  negative endocrine ROS  Renal/GU negative Renal ROS  negative genitourinary   Musculoskeletal   Abdominal   Peds  Hematology negative hematology ROS (+)   Anesthesia Other Findings Past Medical History: No date: Medical history non-contributory Past Surgical History: No date: CYST EXCISION BMI    Body Mass Index:  31.57 kg/m     Reproductive/Obstetrics negative OB ROS                             Anesthesia Physical Anesthesia Plan  ASA: II  Anesthesia Plan: General ETT   Post-op Pain Management:    Induction:   PONV Risk Score and Plan:   Airway Management Planned:   Additional Equipment:   Intra-op Plan:   Post-operative Plan:   Informed Consent: I have reviewed the patients History and Physical, chart, labs and discussed the procedure including the risks, benefits and alternatives for the proposed anesthesia with the patient or authorized representative who has indicated his/her understanding and acceptance.     Dental Advisory Given  Plan Discussed with: CRNA  Anesthesia Plan Comments:         Anesthesia Quick Evaluation

## 2018-08-14 ENCOUNTER — Encounter: Payer: Self-pay | Admitting: Surgery

## 2018-08-31 ENCOUNTER — Ambulatory Visit (INDEPENDENT_AMBULATORY_CARE_PROVIDER_SITE_OTHER): Payer: Self-pay | Admitting: Surgery

## 2018-08-31 ENCOUNTER — Other Ambulatory Visit: Payer: Self-pay

## 2018-08-31 DIAGNOSIS — Z09 Encounter for follow-up examination after completed treatment for conditions other than malignant neoplasm: Secondary | ICD-10-CM

## 2018-09-02 NOTE — Progress Notes (Signed)
Telemedicine Surgical Follow Up  09/02/2018  Patrick Diaz is an 43 y.o. male.   No chief complaint on file.   HPI:  S/p rob UH, doing very well, minimal discomfort. No fevers or chills. I saw the wounds via telemedicine and they are healing well,  No infection or drainage  Location of the patient:home Time spent on call: Total Time spent in the encounter including counseling and coordination of care: 10  Location of the Provider: office The patient had given consent for a telemedicine visit and they understands the limitations associated with this including but not limited to privacy, cyber security and technology issues. Persons participating in the visit: pt    Past Medical History:  Diagnosis Date  . Medical history non-contributory     Past Surgical History:  Procedure Laterality Date  . CYST EXCISION    . ROBOTIC ASSISTED LAPAROSCOPIC VENTRAL/INCISIONAL HERNIA REPAIR N/A 08/13/2018   Procedure: ROBOTIC ASSISTED LAPAROSCOPIC VENTRAL HERNIA REPAIR INTERPRETER APPOINTMENT;  Surgeon: Leafy Ro, MD;  Location: [facility] ORS;  Service: General;  Laterality: N/A;    Family History  Problem Relation Age of Onset  . Cancer Mother   . Healthy Father   . Diabetes Neg Hx   . Hyperlipidemia Neg Hx   . Hypertension Neg Hx     Social History:  reports that he has never smoked. He has never used smokeless tobacco. He reports that he does not drink alcohol or use drugs.  Allergies: No Known Allergies  Medications reviewed.    ROS Full ROS performed and is otherwise negative other than what is stated in HPI   Assessment/Plan: S/p robotic UH repair doing very well. No complications. No heavy lifting RTC prn The pt was provided an opportunity to ask questions and all were answered. The pt was advised to call back or seek in-person evaluation if the symptoms worsen or if the condition fails to improve as anticipated. Greater than 50% of the 10 minutes  visit was  spent in counseling/coordination of care   Patrick Big, MD [facility] General Surgeon

## 2018-09-25 ENCOUNTER — Ambulatory Visit: Payer: Self-pay | Admitting: Family Medicine

## 2018-09-30 ENCOUNTER — Ambulatory Visit (INDEPENDENT_AMBULATORY_CARE_PROVIDER_SITE_OTHER): Payer: Self-pay | Admitting: Family Medicine

## 2018-09-30 ENCOUNTER — Other Ambulatory Visit: Payer: Self-pay

## 2018-09-30 ENCOUNTER — Encounter: Payer: Self-pay | Admitting: Family Medicine

## 2018-09-30 DIAGNOSIS — K432 Incisional hernia without obstruction or gangrene: Secondary | ICD-10-CM

## 2018-09-30 DIAGNOSIS — E782 Mixed hyperlipidemia: Secondary | ICD-10-CM

## 2018-09-30 DIAGNOSIS — R7309 Other abnormal glucose: Secondary | ICD-10-CM

## 2018-09-30 NOTE — Progress Notes (Signed)
Virtual Visit via Telephone Note  I connected with Patrick Diaz on 09/30/18 at  8:30 AM EDT by telephone and verified that I am speaking with the correct person using two identifiers.   I discussed the limitations, risks, security and privacy concerns of performing an evaluation and management service by telephone and the availability of in person appointments. I also discussed with the patient that there may be a patient responsible charge related to this service. The patient expressed understanding and agreed to proceed.   History of Present Illness: Hernia repair  Patrick Diaz recently underwent abdominal surgery to remove a large umbilical hernia. Surgery was completed by Dr. Sterling Big. Since surgery, patient has had a telehealth follow-up with surgeon and continues to reports no post-operative complications. He denies fever, chills, constipation, diarrhea, abdominal pain, nausea or vomiting.   Hyperlipidemia  During patient's office visit on 06/26/2018 patient was found to have elevated lipid panel.  At the time we discussed options of starting statin therapy or modifications of diet with exercise. Patient opted for lifestyle changes in efforts to reduce cholesterol.  He reports today he has significantly made efforts to improve his diet.  He notes that he did initially lose weight postoperatively of his hernia repair but feels he may have gained weight back due to current social isolation orders.  Of note recent hospital admission prior to surgery patient did have an elevated glucose of 158.  He has no history of diabetes.  An A1c was ordered during his last visit however was unable to be processed by the lab.  Denies 3 P's. No known history if diabetes.  Assessment and Plan: 1. Incisional hernia, without obstruction or gangrene -Resolved without complication  2. Mixed hyperlipidemia Continue dietary modifications.  Advised to increase physical activity. Return in 2 weeks for fasting lipid  panel.  3. Elevated glucose Patient to return in 2 weeks for hemoglobin A1c. Encouraged exercise and a modified carbohydrate diet.  Follow Up Instructions: 2 weeks fasting lipid panel   I discussed the assessment and treatment plan with the patient. The patient was provided an opportunity to ask questions and all were answered. The patient agreed with the plan and demonstrated an understanding of the instructions.   The patient was advised to call back or seek an in-person evaluation if the symptoms worsen or if the condition fails to improve as anticipated.  I provided 15 minutes of time during a  non-face-to-face with use of Spanish interpreter during this encounter.    Patrick Courts, FNP-C Primary Care at [facility] [address], [place] [address] [phone]: [phone]

## 2018-10-14 ENCOUNTER — Other Ambulatory Visit: Payer: Self-pay

## 2018-10-14 DIAGNOSIS — Z131 Encounter for screening for diabetes mellitus: Secondary | ICD-10-CM

## 2018-10-14 DIAGNOSIS — E782 Mixed hyperlipidemia: Secondary | ICD-10-CM

## 2018-10-15 LAB — COMPREHENSIVE METABOLIC PANEL
ALT: 28 IU/L (ref 0–44)
AST: 23 IU/L (ref 0–40)
Albumin/Globulin Ratio: 1.9 (ref 1.2–2.2)
Albumin: 4.6 g/dL (ref 4.0–5.0)
Alkaline Phosphatase: 107 IU/L (ref 39–117)
BUN/Creatinine Ratio: 21 — ABNORMAL HIGH (ref 9–20)
BUN: 18 mg/dL (ref 6–24)
Bilirubin Total: 0.4 mg/dL (ref 0.0–1.2)
CO2: 21 mmol/L (ref 20–29)
Calcium: 9.5 mg/dL (ref 8.7–10.2)
Chloride: 106 mmol/L (ref 96–106)
Creatinine, Ser: 0.84 mg/dL (ref 0.76–1.27)
GFR calc Af Amer: 123 mL/min/{1.73_m2} (ref >59)
GFR calc non Af Amer: 106 mL/min/{1.73_m2} (ref >59)
Globulin, Total: 2.4 g/dL (ref 1.5–4.5)
Glucose: 73 mg/dL (ref 65–99)
Potassium: 4.2 mmol/L (ref 3.5–5.2)
Sodium: 142 mmol/L (ref 134–144)
Total Protein: 7 g/dL (ref 6.0–8.5)

## 2018-10-15 LAB — LIPID PANEL
Chol/HDL Ratio: 4.5 ratio (ref 0.0–5.0)
Cholesterol, Total: 224 mg/dL — ABNORMAL HIGH (ref 100–199)
HDL: 50 mg/dL (ref >39)
LDL Calculated: 138 mg/dL — ABNORMAL HIGH (ref 0–99)
Triglycerides: 180 mg/dL — ABNORMAL HIGH (ref 0–149)
VLDL Cholesterol Cal: 36 mg/dL (ref 5–40)

## 2018-10-15 LAB — HEMOGLOBIN A1C
Est. average glucose Bld gHb Est-mCnc: 103 mg/dL
Hgb A1c MFr Bld: 5.2 % (ref 4.8–5.6)

## 2018-10-20 ENCOUNTER — Other Ambulatory Visit: Payer: Self-pay | Admitting: Family Medicine

## 2018-10-20 NOTE — Telephone Encounter (Signed)
Notify patient that overall cholesterol has not improved since previous level was checked.  I do recommend statin therapy at this point to reduce the risk of a cardiovascular event.  I would recommend atorvastatin 20 mg once daily.  He will need to return in 8 weeks for repeat fasting lipid panel and a BMP.

## 2018-10-21 MED ORDER — ATORVASTATIN CALCIUM 20 MG PO TABS: 20.0000 mg | ORAL_TABLET | Freq: Every day | ORAL | 3 refills | Status: DC

## 2018-10-21 NOTE — Telephone Encounter (Signed)
Patient notified of lab results & recommendations. Expressed understanding. Is agreeable to starting Statin therapy. Prescription sent to [facility] pharmacy. Patient scheduled fasting lab appointment on 12/21/18 @ 8:30 AM. Mailed CDC handout on dietary changes to help manage cholesterol.

## 2018-12-21 ENCOUNTER — Other Ambulatory Visit: Payer: Self-pay

## 2018-12-21 DIAGNOSIS — E782 Mixed hyperlipidemia: Secondary | ICD-10-CM

## 2018-12-21 NOTE — Progress Notes (Signed)
Patient here for fasting lipid & BMP. Laurance Flatten, CMA.

## 2018-12-28 LAB — BASIC METABOLIC PANEL WITH GFR
BUN/Creatinine Ratio: 22 — ABNORMAL HIGH (ref 9–20)
BUN: 19 mg/dL (ref 6–24)
CO2: 24 mmol/L (ref 20–29)
Calcium: 9.1 mg/dL (ref 8.7–10.2)
Chloride: 104 mmol/L (ref 96–106)
Creatinine, Ser: 0.87 mg/dL (ref 0.76–1.27)
GFR calc Af Amer: 121 mL/min/1.73
GFR calc non Af Amer: 105 mL/min/1.73
Glucose: 92 mg/dL (ref 65–99)
Potassium: 4.7 mmol/L (ref 3.5–5.2)
Sodium: 141 mmol/L (ref 134–144)

## 2018-12-28 LAB — LIPID PANEL
Chol/HDL Ratio: 3.4 ratio (ref 0.0–5.0)
Cholesterol, Total: 186 mg/dL (ref 100–199)
HDL: 54 mg/dL
LDL Calculated: 115 mg/dL — ABNORMAL HIGH (ref 0–99)
Triglycerides: 85 mg/dL (ref 0–149)
VLDL Cholesterol Cal: 17 mg/dL (ref 5–40)

## 2018-12-28 LAB — SPECIMEN STATUS REPORT

## 2019-01-12 ENCOUNTER — Ambulatory Visit: Payer: Self-pay | Attending: Family Medicine

## 2019-01-12 ENCOUNTER — Other Ambulatory Visit: Payer: Self-pay

## 2019-01-13 ENCOUNTER — Ambulatory Visit (INDEPENDENT_AMBULATORY_CARE_PROVIDER_SITE_OTHER): Payer: Self-pay | Admitting: Nurse Practitioner

## 2019-01-13 ENCOUNTER — Encounter: Payer: Self-pay | Admitting: Nurse Practitioner

## 2019-01-13 ENCOUNTER — Other Ambulatory Visit: Payer: Self-pay

## 2019-01-13 DIAGNOSIS — Z7689 Persons encountering health services in other specified circumstances: Secondary | ICD-10-CM

## 2019-01-13 DIAGNOSIS — E7849 Other hyperlipidemia: Secondary | ICD-10-CM

## 2019-01-13 NOTE — Progress Notes (Signed)
Patient here to discuss recent lab results.

## 2019-01-13 NOTE — Progress Notes (Signed)
Patient notified of results & recommendations. Expressed understanding.

## 2019-01-13 NOTE — Progress Notes (Signed)
Virtual Visit via Telephone Note Due to national recommendations of social distancing due to COVID 19, telehealth visit is felt to be most appropriate for this patient at this time.  I discussed the limitations, risks, security and privacy concerns of performing an evaluation and management service by telephone and the availability of in person appointments. I also discussed with the patient that there may be a patient responsible charge related to this service. The patient expressed understanding and agreed to proceed.    I connected with Patrick Diaz on 01/13/19  at   8:50 AM EDT  EDT by telephone and verified that I am speaking with the correct person using two identifiers.   Consent I discussed the limitations, risks, security and privacy concerns of performing an evaluation and management service by telephone and the availability of in person appointments. I also discussed with the patient that there may be a patient responsible charge related to this service. The patient expressed understanding and agreed to proceed.   Location of Patient: Private  Residence   [facility]    Persons participating in Telemedicine visit: Bertram DenverZelda Fleming FNP-BC YY [place] HillsBien CMA [facility] CenterJuan Diaz  Antonio spanish interpreter [place] #[id number]   History of Present Illness: Telemedicine visit for: Review of labs, Re establish care.    We discussed patient's labs today in detail.  We discussed dietary modifications today in regard to dyslipidemia. He will continue on atorvastatin 20 mg daily at this time. He has no questions at this time. He denies any statin intolerance.     Past Medical History:  Diagnosis Date  . Medical history non-contributory     Past Surgical History:  Procedure Laterality Date  . CYST EXCISION    . ROBOTIC ASSISTED LAPAROSCOPIC VENTRAL/INCISIONAL HERNIA REPAIR N/A 08/13/2018   Procedure: ROBOTIC ASSISTED LAPAROSCOPIC  VENTRAL HERNIA REPAIR INTERPRETER APPOINTMENT;  Surgeon: Leafy RoPabon, Diego F, MD;  Location: [facility] ORS;  Service: General;  Laterality: N/A;    Family History  Problem Relation Age of Onset  . Cancer Mother   . Healthy Father   . Diabetes Neg Hx   . Hyperlipidemia Neg Hx   . Hypertension Neg Hx     Social History   Socioeconomic History  . Marital status: Married    Spouse name: Not on file  . Number of children: Not on file  . Years of education: Not on file  . Highest education level: Not on file  Occupational History  . Not on file  Social Needs  . Financial resource strain: Not on file  . Food insecurity    Worry: Not on file    Inability: Not on file  . Transportation needs    Medical: Not on file    Non-medical: Not on file  Tobacco Use  . Smoking status: Never Smoker  . Smokeless tobacco: Never Used  Substance and Sexual Activity  . Alcohol use: No    Alcohol/week: 0.0 standard drinks  . Drug use: No  . Sexual activity: Never  Lifestyle  . Physical activity    Days per week: Not on file    Minutes per session: Not on file  . Stress: Not on file  Relationships  . Social Musicianconnections    Talks on phone: Not on file    Gets together: Not on file    Attends religious service: Not on file    Active member of club or organization: Not on file    Attends meetings of  clubs or organizations: Not on file    Relationship status: Not on file  Other Topics Concern  . Not on file  Social History Narrative  . Not on file     Observations/Objective: Awake, alert and oriented x 3   Review of Systems  Constitutional: Negative for fever, malaise/fatigue and weight loss.  HENT: Negative.  Negative for nosebleeds.   Eyes: Negative.  Negative for blurred vision, double vision and photophobia.  Respiratory: Negative.  Negative for cough and shortness of breath.   Cardiovascular: Negative.  Negative for chest pain, palpitations and leg swelling.  Gastrointestinal: Negative.   Negative for heartburn, nausea and vomiting.  Musculoskeletal: Negative.  Negative for myalgias.  Neurological: Negative.  Negative for dizziness, focal weakness, seizures and headaches.  Psychiatric/Behavioral: Negative.  Negative for suicidal ideas.    Assessment and Plan:  Diagnoses and all orders for this visit:  Encounter to establish care Follow up as needed.    Follow Up Instructions Return if symptoms worsen or fail to improve.     I discussed the assessment and treatment plan with the patient. The patient was provided an opportunity to ask questions and all were answered. The patient agreed with the plan and demonstrated an understanding of the instructions.   The patient was advised to call back or seek an in-person evaluation if the symptoms worsen or if the condition fails to improve as anticipated.  I provided 14 minutes of non-face-to-face time during this encounter including median intraservice time, reviewing previous notes, labs, imaging, medications and explaining diagnosis and management.  Gildardo Pounds, FNP-BC

## 2019-06-09 ENCOUNTER — Other Ambulatory Visit: Payer: Self-pay | Admitting: Cardiology

## 2019-06-09 DIAGNOSIS — Z20822 Contact with and (suspected) exposure to covid-19: Secondary | ICD-10-CM

## 2019-06-10 LAB — NOVEL CORONAVIRUS, NAA: SARS-CoV-2, NAA: NOT DETECTED

## 2019-09-23 ENCOUNTER — Ambulatory Visit: Payer: Self-pay | Attending: Family Medicine

## 2019-09-23 ENCOUNTER — Other Ambulatory Visit: Payer: Self-pay

## 2019-09-28 ENCOUNTER — Telehealth: Payer: Self-pay

## 2019-09-28 NOTE — Telephone Encounter (Signed)
Called patient to do their pre-visit COVID screening(Patrick Diaz, [id number]).  Have you tested positive for COVID or are you currently waiting for COVID test results? no  Have you recently traveled internationally([place], [place], [place], [place], [place]) or within the [place] to a hotspot area([place], [place], [place], [place], [place])? no  Are you currently experiencing any of the following symptoms: fever, cough, SHOB, fatigue, body aches, loss of smell/taste, rash, diarrhea, vomiting, severe headaches, weakness, sore throat? no  Have you been in contact with anyone who has recently travelled? no  Have you been in contact with anyone who is experiencing any of the above symptoms or been diagnosed with COVID  or works in or has recently visited a SNF? no

## 2019-09-28 NOTE — Patient Instructions (Signed)
Thank you for choosing Primary Care at [facility] to be your medical home!    Patrick Diaz was seen by De Hollingshead, DO today.   William Dalton primary care provider is Marcy Siren, DO.   For the best care possible, you should try to see Marcy Siren, DO whenever you come to the clinic.   We look forward to seeing you again soon!  If you have any questions about your visit today, please call us at [phone] or feel free to reach your primary care provider via MyChart.

## 2019-09-29 ENCOUNTER — Encounter: Payer: Self-pay | Admitting: Internal Medicine

## 2019-09-29 ENCOUNTER — Ambulatory Visit (INDEPENDENT_AMBULATORY_CARE_PROVIDER_SITE_OTHER): Payer: Self-pay | Admitting: Internal Medicine

## 2019-09-29 ENCOUNTER — Other Ambulatory Visit: Payer: Self-pay

## 2019-09-29 VITALS — BP 112/71 | HR 71 | Temp 97.2°F | Resp 17 | Ht 70.0 in | Wt 219.0 lb

## 2019-09-29 DIAGNOSIS — I8392 Asymptomatic varicose veins of left lower extremity: Secondary | ICD-10-CM

## 2019-09-29 DIAGNOSIS — E782 Mixed hyperlipidemia: Secondary | ICD-10-CM

## 2019-09-29 NOTE — Progress Notes (Signed)
  Subjective:    Patrick Diaz - 44 y.o. male MRN [id number]  Date of birth: 20-Jul-1975  HPI  Patrick Diaz is here for follow up. He also has concern about a vein on the back of his left leg. Wants to know what it is. Not painful. Spends a lot of time on his feet--he works as a Education administrator. Never notices any lower extremity edema.   HYPERLIPIDEMIA He reports he has definitely been trying to improve what he is eating.   Symptoms Chest pain on exertion:  no    Leg claudication:   no  Medication Monitoring Compliance- Yes with Atorvastatin 20 mg   Right upper quadrant pain- no    Muscle aches- no    Health Maintenance:  Health Maintenance Due  Topic Date Due  . COVID-19 Vaccine (1) Never done    -  reports that he has never smoked. He has never used smokeless tobacco. - Review of Systems: Per HPI. - Past Medical History: Patient Active Problem List   Diagnosis Date Noted  . Periumbilical abdominal pain 07/01/2018   - Medications: reviewed and updated   Objective:   Physical Exam BP 112/71   Pulse 71   Temp (!) 97.2 F (36.2 C) (Temporal)   Resp 17   Ht 5\' 10"  (1.778 m)   Wt 219 lb (99.3 kg)   SpO2 95%   BMI 31.42 kg/m  Physical Exam  Constitutional: He is oriented to person, place, and time and well-developed, well-nourished, and in no distress. No distress.  HENT:  Head: Normocephalic and atraumatic.  Eyes: Conjunctivae and EOM are normal.  Cardiovascular: Normal rate, regular rhythm and normal heart sounds.  No murmur heard. Swollen, twisted vein present directly underneath dermis that is not painful to touch. No erythema or increased warmth present. No LE edema.   Pulmonary/Chest: Effort normal and breath sounds normal. No respiratory distress.  Musculoskeletal:        General: Normal range of motion.  Neurological: He is alert and oriented to person, place, and time.  Skin: Skin is warm and dry. He is not diaphoretic.  Psychiatric: Affect and  judgment normal.           Assessment & Plan:    1. Mixed hyperlipidemia Last LDL 115. Patient would like to know how his cholesterol panel is with changes in diet. Will plan to repeat when fasting.  - Lipid panel; Future - Comprehensive metabolic panel; Future  2. Asymptomatic varicose veins of left lower extremity Complaint appears consistent with varicose vein. No signs of superficial thrombophlebitis and given lack of pain, edema, or acute onset very low suspicion of DVT. Discussed that these are common and if asymptomatic do not need further work up/treatment unless has cosmetic concerns. Discussed risk factors for developing varicose veins. Patient not interested in pursuing removal at present.      , D.O. 09/29/2019, 4:13 PM Primary Care at [facility]

## 2019-10-05 ENCOUNTER — Telehealth: Payer: Self-pay | Admitting: Internal Medicine

## 2019-10-05 NOTE — Telephone Encounter (Signed)
Pt was sent a letter from financial dept. Inform them, that the application they submitted was incomplete, since they were missing some documentation at the time of the appointment, Pt need to reschedule and resubmit all new papers and application for CAFA and OC, P.S. old documents has been sent back by mail to the Pt and Pt. need to make a new appt. 

## 2019-10-08 ENCOUNTER — Other Ambulatory Visit (INDEPENDENT_AMBULATORY_CARE_PROVIDER_SITE_OTHER): Payer: Self-pay

## 2019-10-08 ENCOUNTER — Other Ambulatory Visit: Payer: Self-pay

## 2019-10-08 DIAGNOSIS — E782 Mixed hyperlipidemia: Secondary | ICD-10-CM

## 2019-10-09 LAB — COMPREHENSIVE METABOLIC PANEL
ALT: 25 IU/L (ref 0–44)
AST: 22 IU/L (ref 0–40)
Albumin/Globulin Ratio: 1.7 (ref 1.2–2.2)
Albumin: 4.2 g/dL (ref 4.0–5.0)
Alkaline Phosphatase: 92 IU/L (ref 39–117)
BUN/Creatinine Ratio: 15 (ref 9–20)
BUN: 13 mg/dL (ref 6–24)
Bilirubin Total: 0.7 mg/dL (ref 0.0–1.2)
CO2: 25 mmol/L (ref 20–29)
Calcium: 9.2 mg/dL (ref 8.7–10.2)
Chloride: 107 mmol/L — ABNORMAL HIGH (ref 96–106)
Creatinine, Ser: 0.89 mg/dL (ref 0.76–1.27)
GFR calc Af Amer: 119 mL/min/{1.73_m2} (ref >59)
GFR calc non Af Amer: 103 mL/min/{1.73_m2} (ref >59)
Globulin, Total: 2.5 g/dL (ref 1.5–4.5)
Glucose: 96 mg/dL (ref 65–99)
Potassium: 4.7 mmol/L (ref 3.5–5.2)
Sodium: 142 mmol/L (ref 134–144)
Total Protein: 6.7 g/dL (ref 6.0–8.5)

## 2019-10-09 LAB — LIPID PANEL
Chol/HDL Ratio: 4 ratio (ref 0.0–5.0)
Cholesterol, Total: 197 mg/dL (ref 100–199)
HDL: 49 mg/dL (ref >39)
LDL Chol Calc (NIH): 126 mg/dL — ABNORMAL HIGH (ref 0–99)
Triglycerides: 123 mg/dL (ref 0–149)
VLDL Cholesterol Cal: 22 mg/dL (ref 5–40)

## 2019-10-11 ENCOUNTER — Other Ambulatory Visit: Payer: Self-pay | Admitting: Internal Medicine

## 2019-10-11 MED ORDER — ATORVASTATIN CALCIUM 40 MG PO TABS: 40.0000 mg | ORAL_TABLET | Freq: Every day | ORAL | 1 refills | Status: DC

## 2019-10-16 ENCOUNTER — Encounter (HOSPITAL_BASED_OUTPATIENT_CLINIC_OR_DEPARTMENT_OTHER): Payer: Self-pay | Admitting: Emergency Medicine

## 2019-10-16 ENCOUNTER — Emergency Department (HOSPITAL_BASED_OUTPATIENT_CLINIC_OR_DEPARTMENT_OTHER)
Admission: EM | Admit: 2019-10-16 | Discharge: 2019-10-16 | Disposition: A | Discharge status: Home / Self Care | Payer: Self-pay | Attending: Emergency Medicine | Admitting: Emergency Medicine

## 2019-10-16 ENCOUNTER — Other Ambulatory Visit: Payer: Self-pay

## 2019-10-16 DIAGNOSIS — R21 Rash and other nonspecific skin eruption: Secondary | ICD-10-CM

## 2019-10-16 DIAGNOSIS — L237 Allergic contact dermatitis due to plants, except food: Secondary | ICD-10-CM | POA: Insufficient documentation

## 2019-10-16 DIAGNOSIS — L03113 Cellulitis of right upper limb: Secondary | ICD-10-CM | POA: Insufficient documentation

## 2019-10-16 LAB — COMPREHENSIVE METABOLIC PANEL
ALT: 24 U/L (ref 0–44)
AST: 22 U/L (ref 15–41)
Albumin: 3.9 g/dL (ref 3.5–5.0)
Alkaline Phosphatase: 71 U/L (ref 38–126)
Anion gap: 8 (ref 5–15)
BUN: 21 mg/dL — ABNORMAL HIGH (ref 6–20)
CO2: 24 mmol/L (ref 22–32)
Calcium: 8.7 mg/dL — ABNORMAL LOW (ref 8.9–10.3)
Chloride: 107 mmol/L (ref 98–111)
Creatinine, Ser: 1.01 mg/dL (ref 0.61–1.24)
GFR calc Af Amer: >60 mL/min (ref >60)
GFR calc non Af Amer: >60 mL/min (ref >60)
Glucose, Bld: 156 mg/dL — ABNORMAL HIGH (ref 70–99)
Potassium: 3.9 mmol/L (ref 3.5–5.1)
Sodium: 139 mmol/L (ref 135–145)
Total Bilirubin: 0.7 mg/dL (ref 0.3–1.2)
Total Protein: 6.7 g/dL (ref 6.5–8.1)

## 2019-10-16 LAB — CBC WITH DIFFERENTIAL/PLATELET
Abs Immature Granulocytes: 0.02 10*3/uL (ref 0.00–0.07)
Basophils Absolute: 0 10*3/uL (ref 0.0–0.1)
Basophils Relative: 0 %
Eosinophils Absolute: 0.4 10*3/uL (ref 0.0–0.5)
Eosinophils Relative: 5 %
HCT: 44.4 % (ref 39.0–52.0)
Hemoglobin: 15.7 g/dL (ref 13.0–17.0)
Immature Granulocytes: 0 %
Lymphocytes Relative: 17 %
Lymphs Abs: 1.4 10*3/uL (ref 0.7–4.0)
MCH: 31.6 pg (ref 26.0–34.0)
MCHC: 35.4 g/dL (ref 30.0–36.0)
MCV: 89.3 fL (ref 80.0–100.0)
Monocytes Absolute: 0.5 10*3/uL (ref 0.1–1.0)
Monocytes Relative: 6 %
Neutro Abs: 6 10*3/uL (ref 1.7–7.7)
Neutrophils Relative %: 72 %
Platelets: 283 10*3/uL (ref 150–400)
RBC: 4.97 MIL/uL (ref 4.22–5.81)
RDW: 12.8 % (ref 11.5–15.5)
WBC: 8.4 10*3/uL (ref 4.0–10.5)
nRBC: 0 % (ref 0.0–0.2)

## 2019-10-16 MED ORDER — METHYLPREDNISOLONE SODIUM SUCC 125 MG IJ SOLR
125.0000 mg | Freq: Once | INTRAMUSCULAR | Status: AC
  Administered 2019-10-16: 125 mg via INTRAVENOUS
  Filled 2019-10-16: qty 2

## 2019-10-16 MED ORDER — PREDNISONE 20 MG PO TABS
ORAL_TABLET | ORAL | 0 refills | Status: DC
Start: 2019-10-16 — End: 2020-04-06

## 2019-10-16 MED ORDER — DOXYCYCLINE HYCLATE 100 MG PO TABS
100.0000 mg | ORAL_TABLET | Freq: Once | ORAL | Status: AC
  Administered 2019-10-16: 100 mg via ORAL
  Filled 2019-10-16: qty 1

## 2019-10-16 MED ORDER — DOXYCYCLINE HYCLATE 100 MG PO CAPS: 100.0000 mg | ORAL_CAPSULE | Freq: Two times a day (BID) | ORAL | 0 refills | Status: AC

## 2019-10-16 NOTE — ED Provider Notes (Signed)
[facility] EMERGENCY DEPARTMENT Provider Note   CSN: [id number] Arrival date & time: 10/16/19  1534     History Chief Complaint  Patient presents with  . Rash  . Arm Swelling    Patrick Diaz is a 44 y.o. male who presents for evaluation of right upper extremity pain, swelling, rash that has been ongoing for 1 week.  He reports that 8 days ago, he was doing some yard work and came across poison ivy.  He started developing a rash on his bilateral upper extremities.  He saw a primary care doctor who gave him a steroid shot and prescribed him cortisone cream,hydroxyzine, Benadryl.  He states he has been taking those medications but despite that, his symptoms got worse.  He saw another doctor a few days ago who gave him another steroid shot.  He reports that since then, his right upper extremity has become more swollen, more painful.  His rash has not improved.  He reports that the most painful area is to the distal upper arm and proximal forearm, elbow.  He states that the rash is pruritic in nature.  He has not had any fever.  He denies any other preceding trauma, injury, fall.  No new medications prior to the ones that he was recently prescribed.  No new laundry detergents, soaps, exposures.  He has not had any tongue or lip swelling, difficulty breathing, vomiting.  The history is provided by the patient.       History reviewed. No pertinent past medical history.  There are no problems to display for this patient.   History reviewed. No pertinent surgical history.     History reviewed. No pertinent family history.  Social History   Tobacco Use  . Smoking status: Not on file  Substance Use Topics  . Alcohol use: Not on file  . Drug use: Not on file    Home Medications Prior to Admission medications   Medication Sig Start Date End Date Taking? Authorizing Provider  doxycycline (VIBRAMYCIN) 100 MG capsule Take 1 capsule (100 mg total) by mouth 2 (two) times  daily for 7 days. 10/16/19 10/23/19  Maxwell Caul, PA-C  predniSONE (DELTASONE) 20 MG tablet Take 2 tablets by mouth for 4 days.  Take 1 tablet by mouth for 2 days. 10/16/19   Maxwell Caul, PA-C    Allergies    Patient has no allergy information on record.  Review of Systems   Review of Systems  Constitutional: Negative for fever.  Respiratory: Negative for shortness of breath.   Gastrointestinal: Negative for vomiting.  Musculoskeletal:       RUE pain and swelling  Skin: Positive for wound.  Neurological: Negative for weakness and numbness.  All other systems reviewed and are negative.   Physical Exam Updated Vital Signs BP 138/90 (BP Location: Left Arm)   Pulse 84   Temp 98.4 F (36.9 C) (Oral)   Resp 18   SpO2 96%   Physical Exam Vitals and nursing note reviewed.  Constitutional:      Appearance: Normal appearance. He is well-developed.  HENT:     Head: Normocephalic and atraumatic.     Mouth/Throat:     Comments: Posterior oropharynx is clear. No erythema, edema. Uvula is midline. Airway is patent, phonation is intact.  Eyes:     General: Lids are normal.     Conjunctiva/sclera: Conjunctivae normal.     Pupils: Pupils are equal, round, and reactive to light.  Neck:  Comments: Dry scaly rash noted to the right paraspinal area of the cervical region.  Cardiovascular:     Rate and Rhythm: Normal rate and regular rhythm.     Pulses: Normal pulses.          Radial pulses are 2+ on the right side and 2+ on the left side.     Heart sounds: Normal heart sounds. No murmur. No friction rub. No gallop.   Pulmonary:     Effort: Pulmonary effort is normal.     Breath sounds: Normal breath sounds.     Comments: Lungs clear to auscultation bilaterally.  Symmetric chest rise.  No wheezing, rales, rhonchi. Abdominal:     Palpations: Abdomen is soft. Abdomen is not rigid.     Tenderness: There is no abdominal tenderness. There is no guarding.  Musculoskeletal:          General: Normal range of motion.     Cervical back: Full passive range of motion without pain.     Comments: Flexion/extension of right elbow intact with any difficulty.  He has full range of motion of right wrist intact with any difficulty.  He can fully flex and extend all 5 digits of his right hand without any difficulty.  The right upper extremity has areas of erythema with maculopapular rash that is scattered throughout the right upper extremity.  There are some areas that have crusted over.  He has some diffuse edema noted to the right upper extremity that extends all the way from the distal upper arm to the dorsal aspect of his right hand.  His compartments are soft.  Skin:    General: Skin is warm and dry.     Capillary Refill: Capillary refill takes less than 2 seconds.     Comments: Right upper extremity has areas of scattered dry, scaly, erythematous rash with some areas that are crusted over.  No active drainage.  Neurological:     Mental Status: He is alert and oriented to person, place, and time.  Psychiatric:        Speech: Speech normal.              ED Results / Procedures / Treatments   Labs (all labs ordered are listed, but only abnormal results are displayed) Labs Reviewed  COMPREHENSIVE METABOLIC PANEL - Abnormal; Notable for the following components:      Result Value   Glucose, Bld 156 (*)    BUN 21 (*)    Calcium 8.7 (*)    All other components within normal limits  CBC WITH DIFFERENTIAL/PLATELET    EKG None  Radiology No results found.  Procedures Procedures (including critical care time)  Medications Ordered in ED Medications  methylPREDNISolone sodium succinate (SOLU-MEDROL) 125 mg/2 mL injection 125 mg (125 mg Intravenous Given 10/16/19 1636)  doxycycline (VIBRA-TABS) tablet 100 mg (100 mg Oral Given 10/16/19 1752)    ED Course  I have reviewed the triage vital signs and the nursing notes.  Pertinent labs & imaging results that were  available during my care of the patient were reviewed by me and considered in my medical decision making (see chart for details).    MDM Rules/Calculators/A&P                      44 year old male who presents for evaluation of right upper extremity rash, pain, swelling.  Symptoms started initially about 8 days when he was involved in a garden work and developed poison  ivy.  He has been seen by doctors and prescribed medications with no improvement in symptoms.  No fevers, vomiting.  On initial ED arrival, he is afebrile, nontoxic-appearing.  Vital signs are stable.  His right upper extremity has some diffuse edema noted throughout with overlying scaly, maculopapular rash.  There is some crusting noted throughout the right upper extremity.  Compartments are soft.  He is having full range of motion without any difficulty.  History/physical exam not concerning for septic arthritis, compartment syndrome.  I suspect he probably developed a superimposed bacterial infection.  He also has some scaly rash to the left upper extremity but does not appear edematous.  CBC shows no leukocytosis or anemia.  CMP is unremarkable.  Discussed results with patient and daughter.  Patient with no known drug allergies.  At this time, I suspect he has a superimposed bacterial infection.  We will plan to put him on antibiotics.  Patient with no known drug allergies.  Additionally, he is on 10 mg of prednisone.  I will adjust to have a burst to help with symptoms.  Instructed patient on at home care measures. At this time, patient exhibits no emergent life-threatening condition that require further evaluation in ED or admission. Patient had ample opportunity for questions and discussion. All patient's questions were answered with full understanding. Strict return precautions discussed. Patient expresses understanding and agreement to plan.   Portions of this note were generated with Scientist, clinical (histocompatibility and immunogenetics). Dictation errors  may occur despite best attempts at proofreading.  Final Clinical Impression(s) / ED Diagnoses Final diagnoses:  Rash  Poison ivy  Cellulitis of right upper extremity    Rx / DC Orders ED Discharge Orders         Ordered    predniSONE (DELTASONE) 20 MG tablet     10/16/19 1746    doxycycline (VIBRAMYCIN) 100 MG capsule  2 times daily     10/16/19 1746           Rosana Hoes 10/16/19 1847    Tilden Fossa, MD 10/17/19 0006

## 2019-10-16 NOTE — Discharge Instructions (Addendum)
Take antibiotics as directed. Please take all of your antibiotics until finished.  As we discussed, if you are outside, you will need to cover as sometimes this antibiotic can cause a rash.  Continue using hydroxyzine and cream.  As we discussed, we will change to you take the prednisone.  After you have finished taking the prednisone that I gave you, you will take the prednisone that you have previously been prescribed.  Take 1 tablet (10 mg) for 2 days and then stop taking it.   Return emergency department for fevers, redness or swelling that begins to spread, drainage from the arm, inability to move the arm or any other worsening concerning symptoms.

## 2019-10-16 NOTE — ED Notes (Signed)
ED Provider at bedside. 

## 2019-10-16 NOTE — ED Notes (Signed)
Pt discharged to home. Discharge instructions have been discussed with patient and/or family members. Pt verbally acknowledges understanding d/c instructions, and endorses comprehension to checkout at registration before leaving.  °

## 2019-10-16 NOTE — ED Triage Notes (Signed)
Pt here with poison ivy that has become severe with swelling to RUE. Was seen by the doctor twice and received steroid shot each time. Nothing brings relief.

## 2019-10-16 NOTE — ED Notes (Signed)
In room to medicate and assess patient.  Denies having any pain just reports itching.  Family at the bedside.  Encouraged to call for assistance as needed.

## 2019-10-18 ENCOUNTER — Encounter: Payer: Self-pay | Admitting: Internal Medicine

## 2019-11-28 IMAGING — CT CT ABD-PELV W/ CM
2 of 5 series · 16 of 46 positions shown, 18 images · IV contrast (OMNIPAQUE)
Comparison: None.

CLINICAL DATA: Periumbilical pain for 6 months. Constipation.
Possible hernia.

EXAM:
CT ABDOMEN AND PELVIS WITH CONTRAST
TECHNIQUE: Multidetector CT imaging of the abdomen and pelvis was performed
using the standard protocol following bolus administration of
intravenous contrast.
CONTRAST:  100mL OMNIPAQUE IOHEXOL 300 MG/ML  SOLN

[Series 2: axial st · axial · 0.91mm/px · z∈[-501,-81]mm · 13 of 100 slices shown, 15 images]
[im 8/100  soft-tissue]
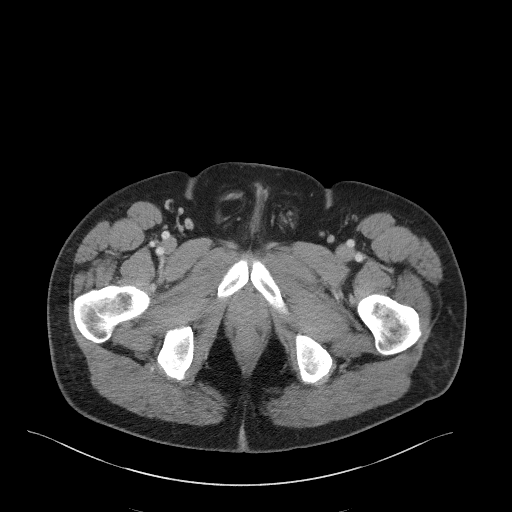
[im 8/100  bone]
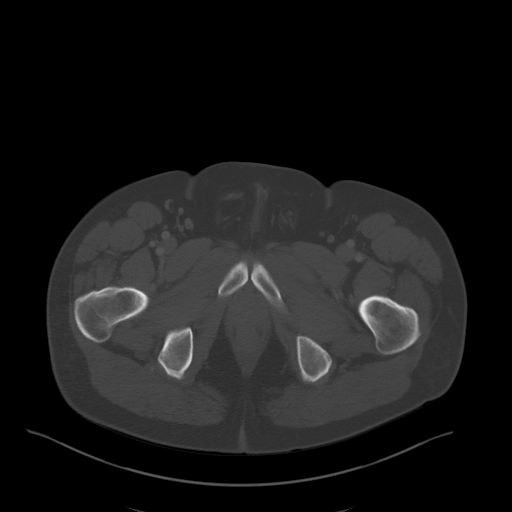
[im 15/100  soft-tissue]
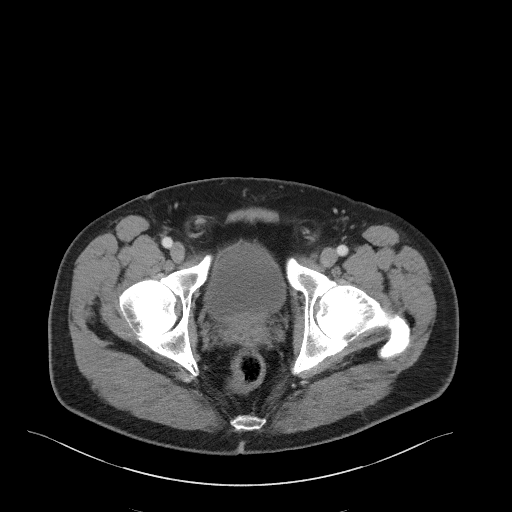
[im 22/100  soft-tissue]
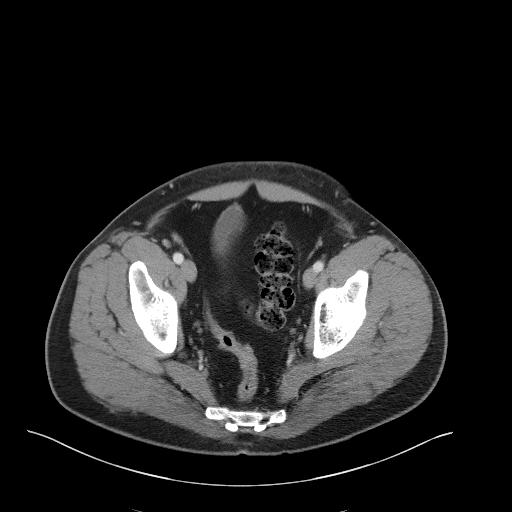
[im 29/100  soft-tissue]
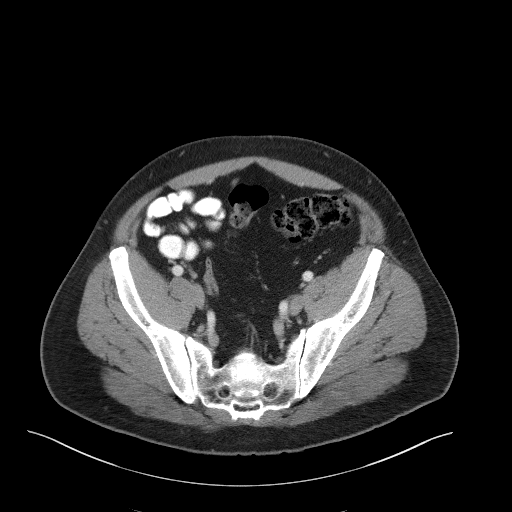
[im 36/100  soft-tissue]
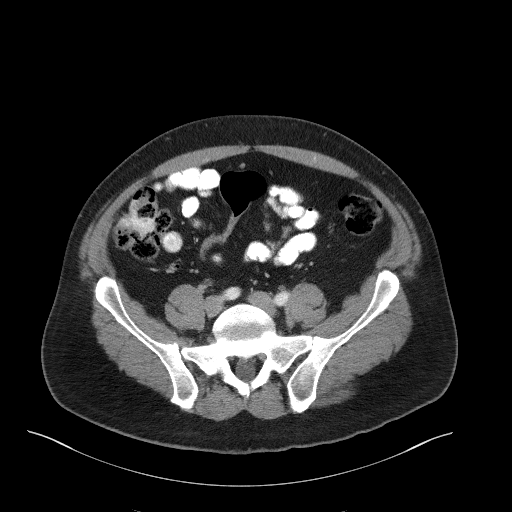
[im 43/100  soft-tissue]
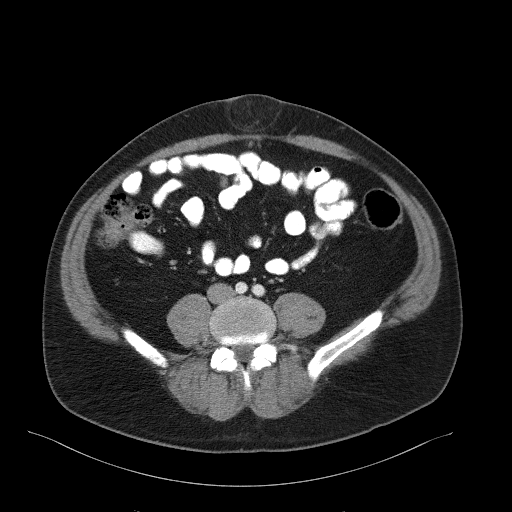
[im 50/100  soft-tissue]
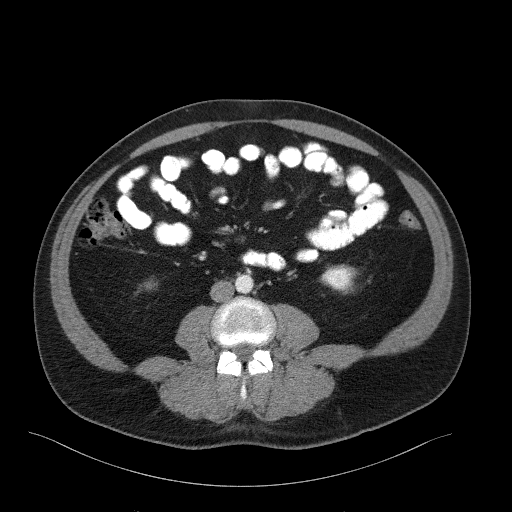
[im 57/100  soft-tissue]
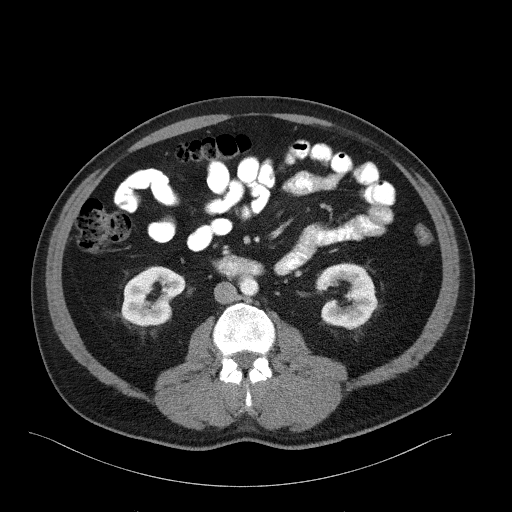
[im 64/100  soft-tissue]
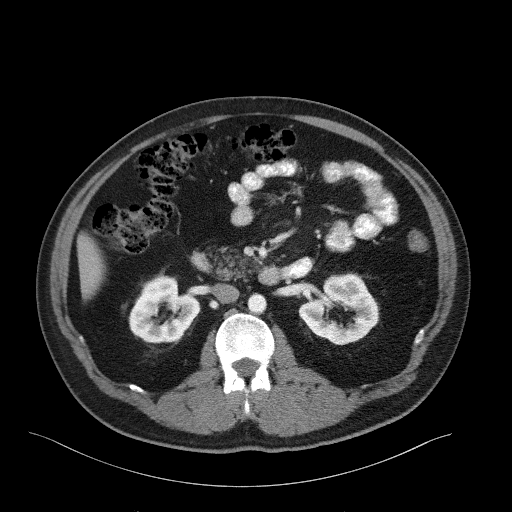
[im 64/100  bone]
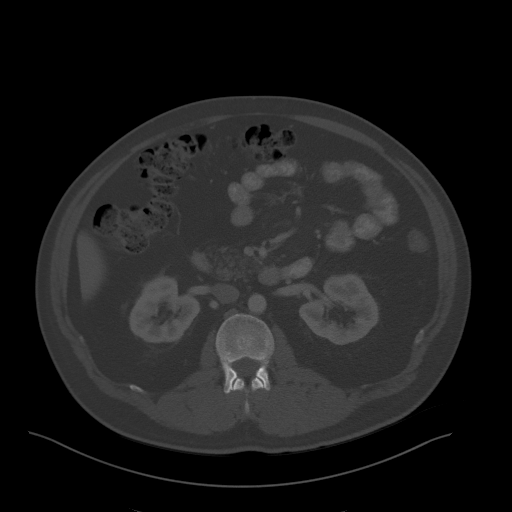
[im 71/100  soft-tissue]
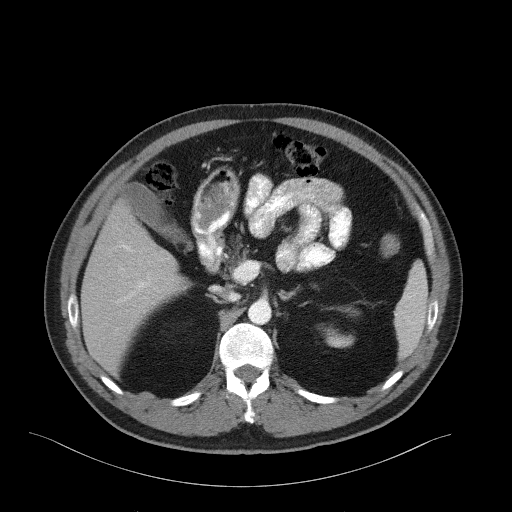
[im 78/100  soft-tissue]
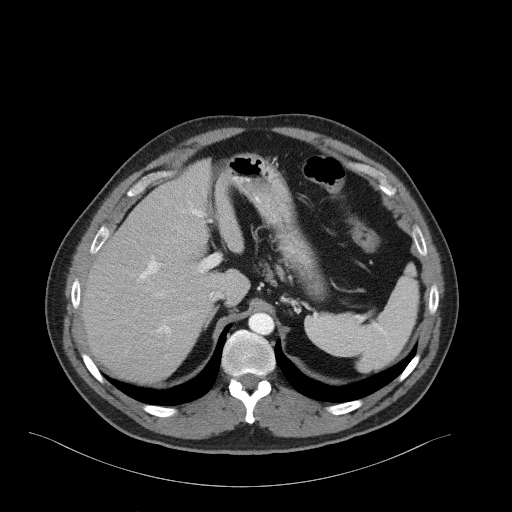
[im 85/100  soft-tissue]
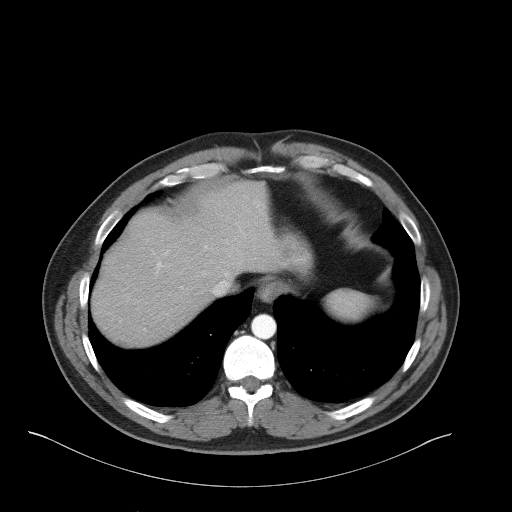
[im 92/100  soft-tissue]
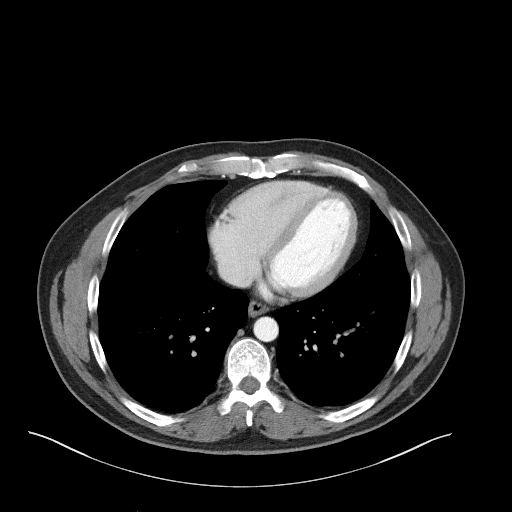

[Series 4: coronal st · coronal · 0.84mm/px · 3 of 114 slices shown]
[im 38/114  soft-tissue]
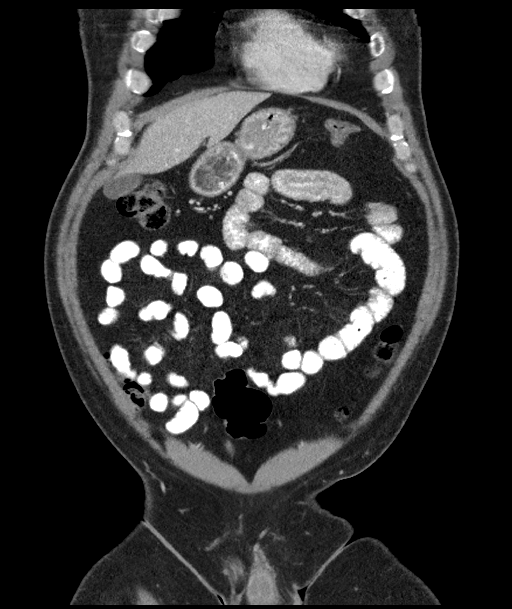
[im 51/114  soft-tissue]
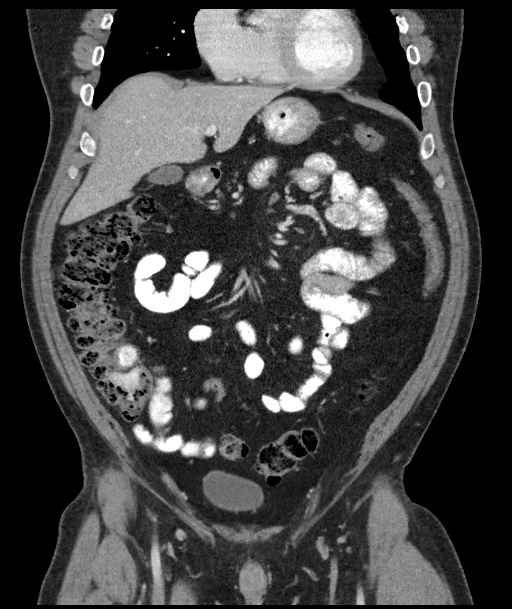
[im 63/114  soft-tissue]
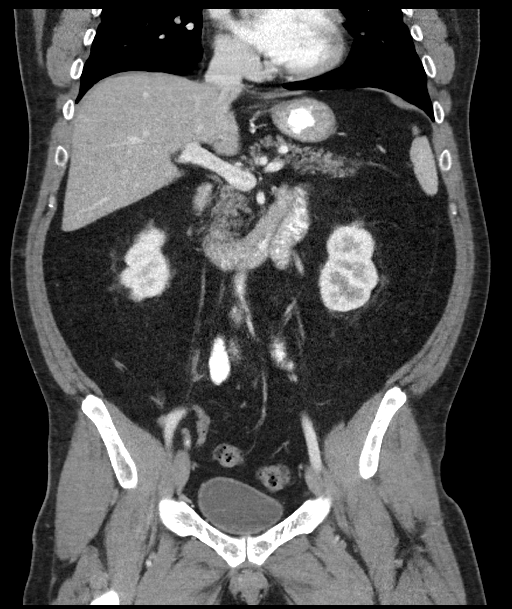

[16 of 46 positions shown; findings below may reference images not displayed]

FINDINGS: Lower chest: No acute abnormality.

Hepatobiliary: Hepatic steatosis. The liver, gallbladder, and portal
vein are otherwise normal.

Pancreas: Unremarkable. No pancreatic ductal dilatation or
surrounding inflammatory changes.

Spleen: Normal in size without focal abnormality.

Adrenals/Urinary Tract: Adrenal glands are unremarkable. Kidneys are
normal, without renal calculi, focal lesion, or hydronephrosis.
Bladder is unremarkable.

Stomach/Bowel: Stomach is within normal limits. Appendix appears
normal. No evidence of bowel wall thickening, distention, or
inflammatory changes.

Vascular/Lymphatic: No significant vascular findings are present. No
enlarged abdominal or pelvic lymph nodes.

Reproductive: Prostate is unremarkable.

Other: There is a periumbilical hernia, likely accounting for the
patient's symptoms. The defect in the anterior abdominal wall
measures 2.3 cm. The herniated fat measures 5.3 by 3.0 by 5.3 cm. No
herniation of bowel.

Musculoskeletal: No acute or significant osseous findings.
IMPRESSION: 1. Fat containing periumbilical hernia as above.
2. Hepatic steatosis.
3. No other acute abnormalities.

## 2020-01-17 ENCOUNTER — Encounter (HOSPITAL_BASED_OUTPATIENT_CLINIC_OR_DEPARTMENT_OTHER): Payer: Self-pay

## 2020-01-17 ENCOUNTER — Other Ambulatory Visit: Payer: Self-pay

## 2020-01-17 DIAGNOSIS — R112 Nausea with vomiting, unspecified: Secondary | ICD-10-CM | POA: Insufficient documentation

## 2020-01-17 DIAGNOSIS — R197 Diarrhea, unspecified: Secondary | ICD-10-CM | POA: Insufficient documentation

## 2020-01-17 DIAGNOSIS — R109 Unspecified abdominal pain: Secondary | ICD-10-CM | POA: Insufficient documentation

## 2020-01-17 LAB — CBC
HCT: 48.6 % (ref 39.0–52.0)
Hemoglobin: 17 g/dL (ref 13.0–17.0)
MCH: 31.5 pg (ref 26.0–34.0)
MCHC: 35 g/dL (ref 30.0–36.0)
MCV: 90 fL (ref 80.0–100.0)
Platelets: 322 10*3/uL (ref 150–400)
RBC: 5.4 MIL/uL (ref 4.22–5.81)
RDW: 12.9 % (ref 11.5–15.5)
WBC: 11.7 10*3/uL — ABNORMAL HIGH (ref 4.0–10.5)
nRBC: 0 % (ref 0.0–0.2)

## 2020-01-17 LAB — URINALYSIS, ROUTINE W REFLEX MICROSCOPIC
Bilirubin Urine: NEGATIVE
Glucose, UA: NEGATIVE mg/dL
Hgb urine dipstick: NEGATIVE
Ketones, ur: 40 mg/dL — AB
Leukocytes,Ua: NEGATIVE
Nitrite: NEGATIVE
Protein, ur: NEGATIVE mg/dL
Specific Gravity, Urine: 1.02 (ref 1.005–1.030)
pH: 8.5 — ABNORMAL HIGH (ref 5.0–8.0)

## 2020-01-17 LAB — COMPREHENSIVE METABOLIC PANEL
ALT: 35 U/L (ref 0–44)
AST: 25 U/L (ref 15–41)
Albumin: 4.3 g/dL (ref 3.5–5.0)
Alkaline Phosphatase: 81 U/L (ref 38–126)
Anion gap: 10 (ref 5–15)
BUN: 16 mg/dL (ref 6–20)
CO2: 26 mmol/L (ref 22–32)
Calcium: 9.5 mg/dL (ref 8.9–10.3)
Chloride: 104 mmol/L (ref 98–111)
Creatinine, Ser: 0.8 mg/dL (ref 0.61–1.24)
GFR calc Af Amer: >60 mL/min (ref >60)
GFR calc non Af Amer: >60 mL/min (ref >60)
Glucose, Bld: 144 mg/dL — ABNORMAL HIGH (ref 70–99)
Potassium: 3.8 mmol/L (ref 3.5–5.1)
Sodium: 140 mmol/L (ref 135–145)
Total Bilirubin: 0.9 mg/dL (ref 0.3–1.2)
Total Protein: 7.6 g/dL (ref 6.5–8.1)

## 2020-01-17 LAB — LIPASE, BLOOD: Lipase: 21 U/L (ref 11–51)

## 2020-01-17 NOTE — ED Triage Notes (Signed)
Pt c/o mid/lower abd pain started yesterday-vomited x 1-denies diarrhea-NAD-steady gait

## 2020-01-18 ENCOUNTER — Emergency Department (HOSPITAL_BASED_OUTPATIENT_CLINIC_OR_DEPARTMENT_OTHER)
Admission: EM | Admit: 2020-01-18 | Discharge: 2020-01-18 | Disposition: A | Discharge status: Home / Self Care | Payer: Self-pay | Attending: Emergency Medicine | Admitting: Emergency Medicine

## 2020-01-18 DIAGNOSIS — R197 Diarrhea, unspecified: Secondary | ICD-10-CM

## 2020-01-18 DIAGNOSIS — R112 Nausea with vomiting, unspecified: Secondary | ICD-10-CM

## 2020-01-18 MED ORDER — ONDANSETRON 4 MG PO TBDP: 4.0000 mg | ORAL_TABLET | Freq: Three times a day (TID) | ORAL | 0 refills | Status: AC | PRN

## 2020-01-18 NOTE — ED Provider Notes (Signed)
[facility] EMERGENCY DEPARTMENT Provider Note  CSN: [id number] Arrival date & time: 01/17/20 2041  Chief Complaint(s) Abdominal Pain  HPI Patrick Diaz is a 44 y.o. male   The history is provided by the patient.  Abdominal Pain Pain location:  Periumbilical Pain quality: cramping   Pain radiates to:  Does not radiate Pain severity:  Moderate Onset quality:  Gradual Duration:  2 days Timing:  Intermittent Progression:  Resolved Chronicity:  New Context: suspicious food intake (ate shrimp a few hours prior to sx onset)   Relieved by:  Nothing Worsened by:  Nothing Associated symptoms: diarrhea (just started within a few hours), nausea and vomiting (NBNB)   Associated symptoms: no chest pain, no chills, no constipation, no cough, no dysuria, no fatigue, no fever and no shortness of breath    While waiting in the waiting room, he has been able to tolerate oral intake.  Past Medical History Past Medical History:  Diagnosis Date  . Medical history non-contributory    Patient Active Problem List   Diagnosis Date Noted  . Mixed hyperlipidemia 09/29/2019   Home Medication(s) Prior to Admission medications   Medication Sig Start Date End Date Taking? Authorizing Provider  atorvastatin (LIPITOR) 40 MG tablet Take 1 tablet (40 mg total) by mouth daily. 10/11/19   Arvilla Market, DO  ondansetron (ZOFRAN ODT) 4 MG disintegrating tablet Take 1 tablet (4 mg total) by mouth every 8 (eight) hours as needed for up to 3 days for nausea or vomiting. 01/18/20 01/21/20  Kendle Turbin, Amadeo Garnet, MD  predniSONE (DELTASONE) 20 MG tablet Take 2 tablets by mouth for 4 days.  Take 1 tablet by mouth for 2 days. 10/16/19   Rosana Hoes                                                                                                                                    Past Surgical History Past Surgical History:  Procedure Laterality Date  . CYST EXCISION    . ROBOTIC  ASSISTED LAPAROSCOPIC VENTRAL/INCISIONAL HERNIA REPAIR N/A 08/13/2018   Procedure: ROBOTIC ASSISTED LAPAROSCOPIC VENTRAL HERNIA REPAIR INTERPRETER APPOINTMENT;  Surgeon: Leafy Ro, MD;  Location: [facility] ORS;  Service: General;  Laterality: N/A;   Family History Family History  Problem Relation Age of Onset  . Cancer Mother   . Healthy Father   . Diabetes Neg Hx   . Hyperlipidemia Neg Hx   . Hypertension Neg Hx     Social History Social History   Tobacco Use  . Smoking status: Never Smoker  . Smokeless tobacco: Never Used  Vaping Use  . Vaping Use: Never used  Substance Use Topics  . Alcohol use: No    Alcohol/week: 0.0 standard drinks  . Drug use: No   Allergies Patient has no known allergies.  Review of Systems Review of Systems  Constitutional: Negative for chills, fatigue and fever.  Respiratory:  Negative for cough and shortness of breath.   Cardiovascular: Negative for chest pain.  Gastrointestinal: Positive for abdominal pain, diarrhea (just started within a few hours), nausea and vomiting (NBNB). Negative for constipation.  Genitourinary: Negative for dysuria.   All other systems are reviewed and are negative for acute change except as noted in the HPI  Physical Exam Vital Signs  I have reviewed the triage vital signs BP 130/78 (BP Location: Right Arm)   Pulse 93   Temp 97.8 F (36.6 C) (Oral)   Resp 14   Ht 5\' 10"  (1.778 m)   Wt 100.7 kg   SpO2 97%   BMI 31.85 kg/m   Physical Exam Vitals reviewed.  Constitutional:      General: He is not in acute distress.    Appearance: He is well-developed. He is not diaphoretic.  HENT:     Head: Normocephalic and atraumatic.     Jaw: No trismus.     Right Ear: External ear normal.     Left Ear: External ear normal.     Nose: Nose normal.  Eyes:     General: No scleral icterus.    Conjunctiva/sclera: Conjunctivae normal.  Neck:     Trachea: Phonation normal.  Cardiovascular:     Rate and Rhythm: Normal  rate and regular rhythm.  Pulmonary:     Effort: Pulmonary effort is normal. No respiratory distress.     Breath sounds: No stridor.  Abdominal:     General: There is no distension.     Tenderness: There is no abdominal tenderness.  Musculoskeletal:        General: Normal range of motion.     Cervical back: Normal range of motion.  Neurological:     Mental Status: He is alert and oriented to person, place, and time.  Psychiatric:        Behavior: Behavior normal.     ED Results and Treatments Labs (all labs ordered are listed, but only abnormal results are displayed) Labs Reviewed  COMPREHENSIVE METABOLIC PANEL - Abnormal; Notable for the following components:      Result Value   Glucose, Bld 144 (*)    All other components within normal limits  CBC - Abnormal; Notable for the following components:   WBC 11.7 (*)    All other components within normal limits  URINALYSIS, ROUTINE W REFLEX MICROSCOPIC - Abnormal; Notable for the following components:   APPearance CLOUDY (*)    pH 8.5 (*)    Ketones, ur 40 (*)    All other components within normal limits  LIPASE, BLOOD                                                                                                                         EKG  EKG Interpretation  Date/Time:    Ventricular Rate:    PR Interval:    QRS Duration:   QT Interval:    QTC Calculation:   R Axis:  Text Interpretation:        Radiology No results found.  Pertinent labs & imaging results that were available during my care of the patient were reviewed by me and considered in my medical decision making (see chart for details).  Medications Ordered in ED Medications - No data to display                                                                                                                                  Procedures Procedures  (including critical care time)  Medical Decision Making / ED Course I have reviewed the nursing notes  for this encounter and the patient's prior records (if available in EHR or on provided paperwork).   Patrick DomeJuan Diaz was evaluated in Emergency Department on 01/18/2020 for the symptoms described in the history of present illness. He was evaluated in the context of the global COVID-19 pandemic, which necessitated consideration that the patient might be at risk for infection with the SARS-CoV-2 virus that causes COVID-19. Institutional protocols and algorithms that pertain to the evaluation of patients at risk for COVID-19 are in a state of rapid change based on information released by regulatory bodies including the CDC and federal and state organizations. These policies and algorithms were followed during the patient's care in the ED.  44 y.o. male presents with vomiting, diarrhea, and abdominal cramping for 2 days. Reports possible suspicious food intake.  Improved oral tolerance. Rest of history as above.  Patient appears well, not in distress, and with no signs of toxicity or dehydration. Abdomen benign.  Rest of the exam as above  Labs reassuring with mild leukocytosis, no significant electrolyte derangements or renal insufficiency.  No evidence of bili obstruction or pancreatitis.  No evidence of urinary tract infection.  Most consistent with viral gastroenteritis vs food poisoning.   No evidence of suggestive of cholinergic toxidrome. Doubt appendicitis, diverticulitis, severe colitis, dysentery.    Able to tolerate oral intake in the ED.  Discussed symptomatic treatment with the patient and they will follow closely with their PCP.       Final Clinical Impression(s) / ED Diagnoses Final diagnoses:  Nausea vomiting and diarrhea    The patient appears reasonably screened and/or stabilized for discharge and I doubt any other medical condition or other [facility] requiring further screening, evaluation, or treatment in the ED at this time prior to discharge. Safe for discharge with strict  return precautions.  Disposition: Discharge  Condition: Good  I have discussed the results, Dx and Tx plan with the patient/family who expressed understanding and agree(s) with the plan. Discharge instructions discussed at length. The patient/family was given strict return precautions who verbalized understanding of the instructions. No further questions at time of discharge.    ED Discharge Orders         Ordered    ondansetron (ZOFRAN ODT) 4 MG disintegrating tablet  Every 8 hours PRN  Discontinue  Reprint     01/18/20 0432          Follow Up: Primary care provider  Schedule an appointment as soon as possible for a visit  in 3-5 days, If symptoms do not improve or  worsen     This chart was dictated using voice recognition software.  Despite best efforts to proofread,  errors can occur which can change the documentation meaning.   Nira Conn, MD 01/18/20 [phone]

## 2020-03-31 ENCOUNTER — Ambulatory Visit: Payer: Self-pay | Admitting: Internal Medicine

## 2020-04-07 ENCOUNTER — Telehealth (INDEPENDENT_AMBULATORY_CARE_PROVIDER_SITE_OTHER): Payer: Self-pay | Admitting: Internal Medicine

## 2020-04-07 ENCOUNTER — Other Ambulatory Visit: Payer: Self-pay

## 2020-04-07 ENCOUNTER — Encounter: Payer: Self-pay | Admitting: Internal Medicine

## 2020-04-07 DIAGNOSIS — E782 Mixed hyperlipidemia: Secondary | ICD-10-CM

## 2020-04-07 DIAGNOSIS — Z13228 Encounter for screening for other metabolic disorders: Secondary | ICD-10-CM

## 2020-04-07 NOTE — Progress Notes (Signed)
Virtual Visit via Telephone Note  I connected with Patrick Diaz, on 04/07/2020 at 8:41 AM by telephone due to the COVID-19 pandemic and verified that I am speaking with the correct person using two identifiers.   Consent: I discussed the limitations, risks, security and privacy concerns of performing an evaluation and management service by telephone and the availability of in person appointments. I also discussed with the patient that there may be a patient responsible charge related to this service. The patient expressed understanding and agreed to proceed.   Location of Patient: Home   Location of Provider: Clinic    Persons participating in Telemedicine visit: Rondo Spittler [facility] Dr. Buel Ream interpreter      History of Present Illness: Patient has a visit to follow up on HLD. He wants to have his cholesterol followed up on. At last visit in May 2021, LDL was 126 and Lipitor was increased. However, seems patient felt his cholesterol was fine at that time and self discontinued his statin. Was not having any side effects of the medication that prompted discontinuation.     Past Medical History:  Diagnosis Date  . Medical history non-contributory    No Known Allergies  Current Outpatient Medications on File Prior to Visit  Medication Sig Dispense Refill  . atorvastatin (LIPITOR) 40 MG tablet Take 1 tablet (40 mg total) by mouth daily. 90 tablet 1  . cholecalciferol (VITAMIN D3) 25 MCG (1000 UNIT) tablet Take 1,000 Units by mouth daily.     No current facility-administered medications on file prior to visit.    Observations/Objective: NAD. Speaking clearly.  Work of breathing normal.  Alert and oriented. Mood appropriate.   Assessment and Plan: 1. Mixed hyperlipidemia Currently not on statin therapy (self discontinued). Will obtain lipid panel and calculate ASCVD risk score to guide therapy.  - Lipid Panel; Future  2.  Screening for metabolic disorder - Comprehensive metabolic panel; Future   Follow Up Instructions: Lab visit 11/8   I discussed the assessment and treatment plan with the patient. The patient was provided an opportunity to ask questions and all were answered. The patient agreed with the plan and demonstrated an understanding of the instructions.   The patient was advised to call back or seek an in-person evaluation if the symptoms worsen or if the condition fails to improve as anticipated.     I provided 12 minutes total of non-face-to-face time during this encounter including median intraservice time, reviewing previous notes, investigations, ordering medications, medical decision making, coordinating care and patient verbalized understanding at the end of the visit.    Marcy Siren, D.O. Primary Care at [facility]  04/07/2020, 8:41 AM

## 2020-04-10 ENCOUNTER — Other Ambulatory Visit: Payer: Self-pay

## 2020-04-14 ENCOUNTER — Other Ambulatory Visit: Payer: Self-pay

## 2020-04-14 ENCOUNTER — Other Ambulatory Visit (INDEPENDENT_AMBULATORY_CARE_PROVIDER_SITE_OTHER): Payer: Self-pay

## 2020-04-14 DIAGNOSIS — Z13228 Encounter for screening for other metabolic disorders: Secondary | ICD-10-CM

## 2020-04-14 DIAGNOSIS — E782 Mixed hyperlipidemia: Secondary | ICD-10-CM

## 2020-04-15 LAB — COMPREHENSIVE METABOLIC PANEL
ALT: 21 IU/L (ref 0–44)
AST: 19 IU/L (ref 0–40)
Albumin/Globulin Ratio: 1.8 (ref 1.2–2.2)
Albumin: 4.3 g/dL (ref 4.0–5.0)
Alkaline Phosphatase: 85 IU/L (ref 44–121)
BUN/Creatinine Ratio: 23 — ABNORMAL HIGH (ref 9–20)
BUN: 18 mg/dL (ref 6–24)
Bilirubin Total: 0.7 mg/dL (ref 0.0–1.2)
CO2: 23 mmol/L (ref 20–29)
Calcium: 9 mg/dL (ref 8.7–10.2)
Chloride: 107 mmol/L — ABNORMAL HIGH (ref 96–106)
Creatinine, Ser: 0.78 mg/dL (ref 0.76–1.27)
GFR calc Af Amer: 125 mL/min/{1.73_m2} (ref >59)
GFR calc non Af Amer: 108 mL/min/{1.73_m2} (ref >59)
Globulin, Total: 2.4 g/dL (ref 1.5–4.5)
Glucose: 97 mg/dL (ref 65–99)
Potassium: 4.3 mmol/L (ref 3.5–5.2)
Sodium: 142 mmol/L (ref 134–144)
Total Protein: 6.7 g/dL (ref 6.0–8.5)

## 2020-04-15 LAB — LIPID PANEL

## 2020-04-16 LAB — LIPID PANEL
Chol/HDL Ratio: 3.6 ratio (ref 0.0–5.0)
Cholesterol, Total: 188 mg/dL (ref 100–199)
HDL: 52 mg/dL (ref >39)
LDL Chol Calc (NIH): 122 mg/dL — ABNORMAL HIGH (ref 0–99)
Triglycerides: 77 mg/dL (ref 0–149)
VLDL Cholesterol Cal: 14 mg/dL (ref 5–40)

## 2020-04-16 LAB — SPECIMEN STATUS REPORT

## 2020-04-20 NOTE — Progress Notes (Signed)
Normal lab letter mailed.

## 2020-06-19 ENCOUNTER — Ambulatory Visit: Payer: Self-pay

## 2020-06-26 ENCOUNTER — Ambulatory Visit: Payer: Self-pay
